# Patient Record
Sex: Male | Born: 1990 | Race: White | Hispanic: No | Marital: Married | Smoking: Never smoker
Health system: Southern US, Community
[De-identification: ages and names within clinical notes are randomized; demographics above are authoritative.]

## PROBLEM LIST (undated history)

## (undated) DIAGNOSIS — J45909 Unspecified asthma, uncomplicated: Secondary | ICD-10-CM

---

## 2012-11-01 ENCOUNTER — Encounter (HOSPITAL_COMMUNITY): Payer: Self-pay

## 2012-11-01 ENCOUNTER — Emergency Department (INDEPENDENT_AMBULATORY_CARE_PROVIDER_SITE_OTHER)
Admission: EM | Admit: 2012-11-01 | Discharge: 2012-11-01 | Disposition: A | Discharge status: Home / Self Care | Payer: Self-pay | Source: Home / Self Care

## 2012-11-01 DIAGNOSIS — J309 Allergic rhinitis, unspecified: Secondary | ICD-10-CM

## 2012-11-01 DIAGNOSIS — J45909 Unspecified asthma, uncomplicated: Secondary | ICD-10-CM

## 2012-11-01 HISTORY — DX: Unspecified asthma, uncomplicated: J45.909

## 2012-11-01 MED ORDER — METHYLPREDNISOLONE 4 MG PO KIT: PACK | ORAL | Status: DC

## 2012-11-01 MED ORDER — FLUTICASONE-SALMETEROL 250-50 MCG/DOSE IN AEPB: 1.0000 | INHALATION_SPRAY | Freq: Two times a day (BID) | RESPIRATORY_TRACT | Status: DC

## 2012-11-01 MED ORDER — ALBUTEROL SULFATE HFA 108 (90 BASE) MCG/ACT IN AERS: 1.0000 | INHALATION_SPRAY | Freq: Four times a day (QID) | RESPIRATORY_TRACT | Status: DC | PRN

## 2012-11-01 NOTE — ED Notes (Signed)
Per patient , he has had SOB today; hist of asthma as child, but no problems for several years. Went to a party over weekend, and smoked for 1st time in many years; NAD at  present

## 2012-11-01 NOTE — ED Provider Notes (Signed)
History     CSN: [id number]  Arrival date & time 11/01/12  1656   First MD Initiated Contact with Patient 11/01/12 1747      Chief Complaint  Patient presents with  . Asthma    (Consider location/radiation/quality/duration/timing/severity/associated sxs/prior treatment) HPI Comments: 22 year old male with a history of asthma recently developed mild allergy symptoms followed by tightness in the chest associated with wheezing. He has been using his brother's Proventil inhaler with modest to moderate relief. He apparently triggered an exacerbation after smoking recently. He denies fever, chills, earache, sore throat. He does state that he has a scratchy throat and PND.   Past Medical History  Diagnosis Date  . Asthma     History reviewed. No pertinent past surgical history.  History reviewed. No pertinent family history.  History  Substance Use Topics  . Smoking status: Not on file  . Smokeless tobacco: Not on file  . Alcohol Use: Not on file      Review of Systems  Constitutional: Positive for activity change. Negative for fever, diaphoresis and fatigue.  HENT: Positive for congestion and postnasal drip. Negative for ear pain, sore throat, facial swelling, rhinorrhea, trouble swallowing, neck pain and neck stiffness.   Eyes: Negative for pain, discharge and redness.  Respiratory: Positive for cough, chest tightness, shortness of breath and wheezing.   Cardiovascular: Negative.   Gastrointestinal: Negative.   Musculoskeletal: Negative.   Neurological: Negative.   Psychiatric/Behavioral: Negative.     Allergies  Ceclor  Home Medications   Current Outpatient Rx  Name  Route  Sig  Dispense  Refill  . albuterol (PROVENTIL HFA;VENTOLIN HFA) 108 (90 BASE) MCG/ACT inhaler   Inhalation   Inhale 1-2 puffs into the lungs every 6 (six) hours as needed for wheezing.   1 Inhaler   0   . Fluticasone-Salmeterol (ADVAIR DISKUS) 250-50 MCG/DOSE AEPB   Inhalation   Inhale 1  puff into the lungs 2 (two) times daily.   60 each   1   . methylPREDNISolone (MEDROL, PAK,) 4 MG tablet      follow package directions   21 tablet   0     BP 146/98  Pulse 78  Temp(Src) 98.2 F (36.8 C) (Oral)  Resp 20  SpO2 98%  Physical Exam  Nursing note and vitals reviewed. Constitutional: He is oriented to person, place, and time. He appears well-developed and well-nourished. No distress.  HENT:  Right Ear: External ear normal.  Left Ear: External ear normal.  Oropharynx with minor erythema, cobblestoning and clear PND. No exudates.  Eyes: Conjunctivae and EOM are normal.  Neck: Normal range of motion. Neck supple.  Cardiovascular: Normal rate, regular rhythm and normal heart sounds.   Pulmonary/Chest: Effort normal. No respiratory distress. He has wheezes.  Mild bilateral and expiratory wheeze with mildly prolonged expiratory phase. Good air movement.  Musculoskeletal: Normal range of motion.  Lymphadenopathy:    He has no cervical adenopathy.  Neurological: He is alert and oriented to person, place, and time.  Skin: Skin is warm and dry. No rash noted.  Psychiatric: He has a normal mood and affect.    ED Course  Procedures (including critical care time)  Labs Reviewed - No data to display No results found.   1. RAD (reactive airway disease) with wheezing   2. Allergic rhinitis due to allergen       MDM  Rx for albuterol HFA 2 puffs every 4-6 hours when necessary cough wheeze Medrol Dosepak Advair discus  2 50/50 one inhalation twice a day. Note that the pharmacist may substitute for HFA 230/21 with same Sig if it  is less expensive. Also take an antihistamine daily may use Allegra 180, Claritin 10 or Zyrtec on a daily basis.  Hayden Rasmussen, NP 11/01/12 [phone]

## 2012-11-03 NOTE — ED Provider Notes (Signed)
Medical screening examination/treatment/procedure(s) were performed by resident physician or non-physician practitioner and as supervising physician I was immediately available for consultation/collaboration.   Barkley Bruns MD.   Linna Hoff, MD 11/03/12 [phone]

## 2016-10-29 ENCOUNTER — Encounter (INDEPENDENT_AMBULATORY_CARE_PROVIDER_SITE_OTHER): Payer: Self-pay

## 2016-10-29 ENCOUNTER — Encounter: Payer: Self-pay | Admitting: Family Medicine

## 2016-10-29 ENCOUNTER — Ambulatory Visit (INDEPENDENT_AMBULATORY_CARE_PROVIDER_SITE_OTHER): Payer: Federal, State, Local not specified - PPO | Admitting: Family Medicine

## 2016-10-29 VITALS — BP 100/80 | HR 78 | Ht 70.5 in | Wt 231.0 lb

## 2016-10-29 DIAGNOSIS — Z7689 Persons encountering health services in other specified circumstances: Secondary | ICD-10-CM

## 2016-10-29 DIAGNOSIS — Z6832 Body mass index (BMI) 32.0-32.9, adult: Secondary | ICD-10-CM | POA: Diagnosis not present

## 2016-10-29 DIAGNOSIS — Z23 Encounter for immunization: Secondary | ICD-10-CM

## 2016-10-29 DIAGNOSIS — J4520 Mild intermittent asthma, uncomplicated: Secondary | ICD-10-CM | POA: Diagnosis not present

## 2016-10-29 LAB — LIPID PANEL
Cholesterol: 170 mg/dL (ref 0–200)
HDL: 37.7 mg/dL — ABNORMAL LOW (ref >39.00)
NONHDL: 132.32
Total CHOL/HDL Ratio: 5
Triglycerides: 216 mg/dL — ABNORMAL HIGH (ref 0.0–149.0)
VLDL: 43.2 mg/dL — ABNORMAL HIGH (ref 0.0–40.0)

## 2016-10-29 LAB — LDL CHOLESTEROL, DIRECT: LDL DIRECT: 101 mg/dL

## 2016-10-29 NOTE — Progress Notes (Signed)
   Subjective:    Patient ID: Gilbert Herrera, male    DOB: Dec 04, 1990, 26 y.o.   MRN: [id number]  HPI This is a 26 yo male who presents today to establish care. He requests tdap. His wife is being induced today.  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 Wants to go to med school, considering dermatology. Currently at [employer], considering [facility] [employer]. Had active job, now with more sedentary job, has gained 30 pounds.   Asthma- rare use of inhaler, once every 3 months. Rare URI.   Past Medical History:  Diagnosis Date  . Asthma    History reviewed. No pertinent surgical history. Family History  Problem Relation Age of Onset  . Hypertension Father   . Heart attack Maternal Grandfather   . Cancer Paternal Grandmother    Social History  Substance Use Topics  . Smoking status: Never Smoker  . Smokeless tobacco: Never Used  . Alcohol use Yes     Comment: Socially      Review of Systems  Constitutional: Negative.   Respiratory: Negative.   Cardiovascular: Negative.   Psychiatric/Behavioral: Negative.        Objective:   Physical Exam Physical Exam  Constitutional: Oriented to person, place, and time. He appears well-developed and well-nourished.  HENT:  Head: Normocephalic and atraumatic.  Eyes: Conjunctivae are normal.  Neck: Normal range of motion. Neck supple.  Cardiovascular: Normal rate, regular rhythm and normal heart sounds.   Pulmonary/Chest: Effort normal and breath sounds normal.  Musculoskeletal: Normal range of motion.  Neurological: Alert and oriented to person, place, and time.  Skin: Skin is warm and dry.  Psychiatric: Normal mood and affect. Behavior is normal. Judgment and thought content normal.  Vitals reviewed.    BP 100/80   Pulse 78   Ht 5' 10.5" (1.791 m)   Wt 231 lb (104.8 kg)   SpO2 98%   BMI 32.68 kg/m  [facility] screen [facility] 10/29/2016  Decreased Interest 0  Down, Depressed, Hopeless 0  PHQ - 2 Score 0     Assessment & Plan:  1. Need for Tdap vaccination - Tdap vaccine greater than or equal to 7yo IM  2. Encounter to establish care - Discussed and encouraged healthy lifestyle choices- adequate sleep, regular exercise, stress management and healthy food choices.    3. BMI 32.0-32.9,adult - encouraged him to limit fast foods, to increase vegetables - Lipid panel  - Return in 1 year Olean Reeeborah Gessner, FNP-BC  [facility] [place]  10/29/2016 2:06 PM

## 2016-10-29 NOTE — Patient Instructions (Signed)

## 2016-10-31 ENCOUNTER — Telehealth: Payer: Self-pay | Admitting: General Practice

## 2016-10-31 NOTE — Telephone Encounter (Signed)
Patient is returning your call RE lab results. ° °Thank you, ° °-LL °

## 2017-05-08 ENCOUNTER — Ambulatory Visit: Payer: Federal, State, Local not specified - PPO | Admitting: Family Medicine

## 2017-08-28 ENCOUNTER — Encounter: Payer: Self-pay | Admitting: Family Medicine

## 2017-08-28 ENCOUNTER — Ambulatory Visit (INDEPENDENT_AMBULATORY_CARE_PROVIDER_SITE_OTHER): Payer: 59 | Admitting: Family Medicine

## 2017-08-28 VITALS — BP 128/78 | HR 97 | Temp 98.1°F | Wt 233.5 lb

## 2017-08-28 DIAGNOSIS — J4520 Mild intermittent asthma, uncomplicated: Secondary | ICD-10-CM | POA: Diagnosis not present

## 2017-08-28 DIAGNOSIS — M25562 Pain in left knee: Secondary | ICD-10-CM

## 2017-08-28 DIAGNOSIS — Z6832 Body mass index (BMI) 32.0-32.9, adult: Secondary | ICD-10-CM | POA: Diagnosis not present

## 2017-08-28 DIAGNOSIS — M25561 Pain in right knee: Secondary | ICD-10-CM | POA: Diagnosis not present

## 2017-08-28 MED ORDER — MELOXICAM 15 MG PO TABS: 15.0000 mg | ORAL_TABLET | Freq: Every day | ORAL | 0 refills | Status: DC | PRN

## 2017-08-28 MED ORDER — FLUTICASONE-SALMETEROL 250-50 MCG/DOSE IN AEPB: 1.0000 | INHALATION_SPRAY | Freq: Two times a day (BID) | RESPIRATORY_TRACT | 5 refills | Status: DC

## 2017-08-28 NOTE — Progress Notes (Signed)
Subjective:    Patient ID: Gilbert Herrera, male    DOB: Mar 29, 1991, 27 y.o.   MRN: [id number]  HPI This is a 27 yo male, accompanied by his wife and infant daughter, who presents today with bilateral knee pain for about a month. Has noticed since moving into a new house l that he has some popping and pain on lateral aspects. No swelling noticed. No pain with rest. Some pain with dead lifts, squats, not with leg extensions. Has taken ibuprofen 2 tablets once a day or every other day with temporary relief. Has noticed that knees are more achy in general with weight gain. No recent falls, no trauma, no weakness.   Drinks 2 20 ounce sodas and a bottle of regular Gatorade daily. Exercises 1-2 times per week.   Past Medical History:  Diagnosis Date  . Asthma    No past surgical history on file. Family History  Problem Relation Age of Onset  . Hypertension Father   . Heart attack Maternal Grandfather   . Cancer Paternal Grandmother    Social History   Tobacco Use  . Smoking status: Never Smoker  . Smokeless tobacco: Never Used  Substance Use Topics  . Alcohol use: Yes    Comment: Socially  . Drug use: No      Review of Systems Per HPI    Objective:   Physical Exam  Constitutional: He is oriented to person, place, and time. He appears well-developed and well-nourished. No distress.  Obese.   HENT:  Head: Normocephalic and atraumatic.  Eyes: Conjunctivae are normal.  Cardiovascular: Normal rate.  Pulmonary/Chest: Effort normal.  Musculoskeletal: He exhibits no edema.       Right knee: He exhibits normal range of motion, no swelling, no effusion, normal alignment, no LCL laxity, normal patellar mobility, no bony tenderness, normal meniscus and no MCL laxity. No tenderness found.       Left knee: He exhibits normal range of motion, no swelling, no effusion, normal alignment, no LCL laxity, normal patellar mobility, no bony tenderness, normal meniscus and no MCL laxity. No  tenderness found.  Audible clicking with extension of both knees.   Neurological: He is alert and oriented to person, place, and time.  Skin: Skin is warm and dry. He is not diaphoretic.  Psychiatric: He has a normal mood and affect. His behavior is normal. Judgment and thought content normal.  Vitals reviewed.     .BP 128/78   Pulse 97   Temp 98.1 F (36.7 C) (Oral)   Wt 233 lb 8 oz (105.9 kg)   SpO2 95%   BMI 33.03 kg/m  Weight: 233 lb 8 oz (105.9 kg)  Wt Readings from Last 3 Encounters:  08/28/17 233 lb 8 oz (105.9 kg)  10/29/16 231 lb (104.8 kg)       Assessment & Plan:  1. Acute pain of both knees -Provided written and verbal information regarding diagnosis and treatment. - provided written instructions for knee exercises - if not improved in 2-3 weeks, suggested he see Dr. Patsy Lager - meloxicam (MOBIC) 15 MG tablet; Take 1 tablet (15 mg total) by mouth daily as needed for pain.  Dispense: 30 tablet; Refill: 0  2. Mild intermittent asthma without complication - Fluticasone-Salmeterol (ADVAIR DISKUS) 250-50 MCG/DOSE AEPB; Inhale 1 puff into the lungs 2 (two) times daily.  Dispense: 60 each; Refill: 5  3. BMI 32.0-32.9,adult - discussed goal weight and encouraged him to avoid calorie containing beverages, increase lean protein, vegetables, increase  daily walking  - F/U for CPE in 3-6 months  Gilbert Reeeborah Gessner, FNP-BC  [facility]  08/29/2017 8:19 AM

## 2017-08-28 NOTE — Patient Instructions (Signed)
Please take meloxicam daily for 5-7 days then daily as needed If not better in 2 weeks, follow up with Dr. Patsy [place]  Knee Exercises Ask your health care provider which exercises are safe for you. Do exercises exactly as told by your health care provider and adjust them as directed. It is normal to feel mild stretching, pulling, tightness, or discomfort as you do these exercises, but you should stop right away if you feel sudden pain or your pain gets worse.Do not begin these exercises until told by your health care provider. STRETCHING AND RANGE OF MOTION EXERCISES These exercises warm up your muscles and joints and improve the movement and flexibility of your knee. These exercises also help to relieve pain, numbness, and tingling. Exercise A: Knee Extension, Prone 1. Lie on your abdomen on a bed. 2. Place your left / right knee just beyond the edge of the surface so your knee is not on the bed. You can put a towel under your left / right thigh just above your knee for comfort. 3. Relax your leg muscles and allow gravity to straighten your knee. You should feel a stretch behind your left / right knee. 4. Hold this position for __________ seconds. 5. Scoot up so your knee is supported between repetitions. Repeat __________ times. Complete this stretch __________ times a day. Exercise B: Knee Flexion, Active  1. Lie on your back with both knees straight. If this causes back discomfort, bend your left / right knee so your foot is flat on the floor. 2. Slowly slide your left / right heel back toward your buttocks until you feel a gentle stretch in the front of your knee or thigh. 3. Hold this position for __________ seconds. 4. Slowly slide your left / right heel back to the starting position. Repeat __________ times. Complete this exercise __________ times a day. Exercise C: Quadriceps, Prone  1. Lie on your abdomen on a firm surface, such as a bed or padded floor. 2. Bend your left / right knee  and hold your ankle. If you cannot reach your ankle or pant leg, loop a belt around your foot and grab the belt instead. 3. Gently pull your heel toward your buttocks. Your knee should not slide out to the side. You should feel a stretch in the front of your thigh and knee. 4. Hold this position for __________ seconds. Repeat __________ times. Complete this stretch __________ times a day. Exercise D: Hamstring, Supine 1. Lie on your back. 2. Loop a belt or towel over the ball of your left / right foot. The ball of your foot is on the walking surface, right under your toes. 3. Straighten your left / right knee and slowly pull on the belt to raise your leg until you feel a gentle stretch behind your knee. ? Do not let your left / right knee bend while you do this. ? Keep your other leg flat on the floor. 4. Hold this position for __________ seconds. Repeat __________ times. Complete this stretch __________ times a day. STRENGTHENING EXERCISES These exercises build strength and endurance in your knee. Endurance is the ability to use your muscles for a long time, even after they get tired. Exercise E: Quadriceps, Isometric  1. Lie on your back with your left / right leg extended and your other knee bent. Put a rolled towel or small pillow under your knee if told by your health care provider. 2. Slowly tense the muscles in the front of your  left / right thigh. You should see your kneecap slide up toward your hip or see increased dimpling just above the knee. This motion will push the back of the knee toward the floor. 3. For __________ seconds, keep the muscle as tight as you can without increasing your pain. 4. Relax the muscles slowly and completely. Repeat __________ times. Complete this exercise __________ times a day. Exercise F: Straight Leg Raises - Quadriceps 1. Lie on your back with your left / right leg extended and your other knee bent. 2. Tense the muscles in the front of your left /  right thigh. You should see your kneecap slide up or see increased dimpling just above the knee. Your thigh may even shake a bit. 3. Keep these muscles tight as you raise your leg 4-6 inches (10-15 cm) off the floor. Do not let your knee bend. 4. Hold this position for __________ seconds. 5. Keep these muscles tense as you lower your leg. 6. Relax your muscles slowly and completely after each repetition. Repeat __________ times. Complete this exercise __________ times a day. Exercise G: Hamstring, Isometric 1. Lie on your back on a firm surface. 2. Bend your left / right knee approximately __________ degrees. 3. Dig your left / right heel into the surface as if you are trying to pull it toward your buttocks. Tighten the muscles in the back of your thighs to dig as hard as you can without increasing any pain. 4. Hold this position for __________ seconds. 5. Release the tension gradually and allow your muscles to relax completely for __________ seconds after each repetition. Repeat __________ times. Complete this exercise __________ times a day. Exercise H: Hamstring Curls  If told by your health care provider, do this exercise while wearing ankle weights. Begin with __________ weights. Then increase the weight by 1 lb (0.5 kg) increments. Do not wear ankle weights that are more than __________. 1. Lie on your abdomen with your legs straight. 2. Bend your left / right knee as far as you can without feeling pain. Keep your hips flat against the floor. 3. Hold this position for __________ seconds. 4. Slowly lower your leg to the starting position.  Repeat __________ times. Complete this exercise __________ times a day. Exercise I: Squats (Quadriceps) 1. Stand in front of a table, with your feet and knees pointing straight ahead. You may rest your hands on the table for balance but not for support. 2. Slowly bend your knees and lower your hips like you are going to sit in a chair. ? Keep your  weight over your heels, not over your toes. ? Keep your lower legs upright so they are parallel with the table legs. ? Do not let your hips go lower than your knees. ? Do not bend lower than told by your health care provider. ? If your knee pain increases, do not bend as low. 3. Hold the squat position for __________ seconds. 4. Slowly push with your legs to return to standing. Do not use your hands to pull yourself to standing. Repeat __________ times. Complete this exercise __________ times a day. Exercise J: Wall Slides (Quadriceps)  1. Lean your back against a smooth wall or door while you walk your feet out 18-24 inches (46-61 cm) from it. 2. Place your feet hip-width apart. 3. Slowly slide down the wall or door until your knees bend __________ degrees. Keep your knees over your heels, not over your toes. Keep your knees in line with  your hips. 4. Hold for __________ seconds. Repeat __________ times. Complete this exercise __________ times a day. Exercise K: Straight Leg Raises - Hip Abductors 1. Lie on your side with your left / right leg in the top position. Lie so your head, shoulder, knee, and hip line up. You may bend your bottom knee to help you keep your balance. 2. Roll your hips slightly forward so your hips are stacked directly over each other and your left / right knee is facing forward. 3. Leading with your heel, lift your top leg 4-6 inches (10-15 cm). You should feel the muscles in your outer hip lifting. ? Do not let your foot drift forward. ? Do not let your knee roll toward the ceiling. 4. Hold this position for __________ seconds. 5. Slowly return your leg to the starting position. 6. Let your muscles relax completely after each repetition. Repeat __________ times. Complete this exercise __________ times a day. Exercise L: Straight Leg Raises - Hip Extensors 1. Lie on your abdomen on a firm surface. You can put a pillow under your hips if that is more  comfortable. 2. Tense the muscles in your buttocks and lift your left / right leg about 4-6 inches (10-15 cm). Keep your knee straight as you lift your leg. 3. Hold this position for __________ seconds. 4. Slowly lower your leg to the starting position. 5. Let your leg relax completely after each repetition. Repeat __________ times. Complete this exercise __________ times a day. This information is not intended to replace advice given to you by your health care provider. Make sure you discuss any questions you have with your health care provider. Document Released: 04/09/2005 Document Revised: 02/18/2016 Document Reviewed: 04/01/2015 Elsevier Interactive Patient Education  2018 [employer].

## 2017-08-29 ENCOUNTER — Encounter: Payer: Self-pay | Admitting: Family Medicine

## 2017-09-11 DIAGNOSIS — A084 Viral intestinal infection, unspecified: Secondary | ICD-10-CM | POA: Diagnosis not present

## 2017-10-12 ENCOUNTER — Ambulatory Visit
Admission: RE | Admit: 2017-10-12 | Discharge: 2017-10-12 | Disposition: A | Discharge status: Home / Self Care | Payer: 59 | Source: Ambulatory Visit | Attending: Family Medicine | Admitting: Family Medicine

## 2017-10-12 ENCOUNTER — Ambulatory Visit (INDEPENDENT_AMBULATORY_CARE_PROVIDER_SITE_OTHER)
Admission: RE | Admit: 2017-10-12 | Discharge: 2017-10-12 | Disposition: A | Discharge status: Home / Self Care | Payer: 59 | Source: Ambulatory Visit | Attending: Family Medicine | Admitting: Family Medicine

## 2017-10-12 ENCOUNTER — Other Ambulatory Visit: Payer: Self-pay

## 2017-10-12 ENCOUNTER — Encounter: Payer: Self-pay | Admitting: Family Medicine

## 2017-10-12 ENCOUNTER — Ambulatory Visit: Payer: 59 | Admitting: Family Medicine

## 2017-10-12 VITALS — BP 90/60 | HR 73 | Temp 97.8°F | Ht 70.5 in | Wt 232.2 lb

## 2017-10-12 DIAGNOSIS — M2241 Chondromalacia patellae, right knee: Secondary | ICD-10-CM

## 2017-10-12 DIAGNOSIS — M25562 Pain in left knee: Secondary | ICD-10-CM | POA: Diagnosis not present

## 2017-10-12 DIAGNOSIS — M2242 Chondromalacia patellae, left knee: Principal | ICD-10-CM

## 2017-10-12 DIAGNOSIS — J4520 Mild intermittent asthma, uncomplicated: Secondary | ICD-10-CM | POA: Diagnosis not present

## 2017-10-12 DIAGNOSIS — M25561 Pain in right knee: Secondary | ICD-10-CM | POA: Diagnosis not present

## 2017-10-12 MED ORDER — ALBUTEROL SULFATE HFA 108 (90 BASE) MCG/ACT IN AERS: 1.0000 | INHALATION_SPRAY | Freq: Four times a day (QID) | RESPIRATORY_TRACT | 2 refills | Status: DC | PRN

## 2017-10-12 MED ORDER — FLUTICASONE-SALMETEROL 250-50 MCG/DOSE IN AEPB: 1.0000 | INHALATION_SPRAY | Freq: Two times a day (BID) | RESPIRATORY_TRACT | 2 refills | Status: DC

## 2017-10-12 NOTE — Patient Instructions (Signed)
Chondromalacia patella

## 2017-10-12 NOTE — Progress Notes (Signed)
Dr. Karleen Hampshire T. Biridiana Twardowski, MD, CAQ [facility] [address] [place], [address] Phone: [phone] Fax: [phone]  10/12/2017  Patient: Gilbert Herrera, MRN: [id number], DOB: Dec 31, 1990, 27 y.o.  Primary Physician:  Emi Belfast, FNP   Chief Complaint  Patient presents with  . Knees Popping    Not painful   Subjective:   Gilbert Herrera is a 27 y.o. 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  He was referred to me for evaluation for over some anterior  knee crepitus without any significant pain.  He has not had any kind of injury that he can recall.  He has not had an effusion.  He has not any giving leg.  He is having some early anterior clicking and popping with movement and with going downstairs.  He was more concerned that this could be an indicator for some other more major problem.  He has tried some anti-inflammatories, but these have not really helped.  Moved a little while ago - has some patellar crepitus. With anterior -   Past Medical History, Surgical History, Social History, Family History, Problem List, Medications, and Allergies have been reviewed and updated if relevant.  Patient Active Problem List   Diagnosis Date Noted  . BMI 32.0-32.9,adult 10/29/2016  . Mild intermittent asthma without complication 10/29/2016    Past Medical History:  Diagnosis Date  . Asthma     History reviewed. No pertinent surgical history.  Social History   Socioeconomic History  . Marital status: Married    Spouse name: Not on file  . Number of children: Not on file  . Years of education: Not on file  . Highest education level: Not on file  Occupational History  . Not on file  Social Needs  . Financial resource strain: Not on file  . Food insecurity:    Worry: Not on file   Inability: Not on file  . Transportation needs:    Medical: Not on file    Non-medical: Not on file  Tobacco Use  . Smoking status: Never Smoker  . Smokeless tobacco: Never Used  Substance and Sexual Activity  . Alcohol use: Yes    Comment: Socially  . Drug use: No  . Sexual activity: Not on file  Lifestyle  . Physical activity:    Days per week: Not on file    Minutes per session: Not on file  . Stress: Not on file  Relationships  . Social connections:    Talks on phone: Not on file    Gets together: Not on file    Attends religious service: Not on file    Active member of club or organization: Not on file    Attends meetings of clubs or organizations: Not on file    Relationship status: Not on file  . Intimate partner violence:    Fear of current or ex partner: Not on file    Emotionally abused: Not on file    Physically abused: Not on file    Forced sexual activity: Not on file  Other Topics Concern  . Not on file  Social History Narrative  . Not on file    Family History  Problem Relation Age of Onset  . Hypertension Father   . Heart attack Maternal Grandfather   .  Cancer Paternal Grandmother     Allergies  Allergen Reactions  . Ceclor [Cefaclor]     Medication list reviewed and updated in full in [facility].  GEN: No fevers, chills. Nontoxic. Primarily MSK c/o today. MSK: Detailed in the HPI GI: tolerating PO intake without difficulty Neuro: No numbness, parasthesias, or tingling associated. Otherwise the pertinent positives of the ROS are noted above.   Objective:   BP 90/60   Pulse 73   Temp 97.8 F (36.6 C) (Oral)   Ht 5' 10.5" (1.791 m)   Wt 232 lb 4 oz (105.3 kg)   BMI 32.85 kg/m    GEN: WDWN, NAD, Non-toxic, Alert & Oriented x 3 HEENT: Atraumatic, Normocephalic.  Ears and Nose: No external deformity. EXTR: No clubbing/cyanosis/edema NEURO: Normal gait.  PSYCH: Normally interactive. Conversant. Not depressed or anxious  appearing.  Calm demeanor.   Knee:  B Gait: Normal heel toe pattern ROM: 0-135 Effusion: neg Echymosis or edema: none Patellar tendon NT Painful PLICA: neg Patellar grind: He does not have a true patellar grind, but he does have some mechanical clicking and popping adjacent to the kneecap and behind the kneecap with flexion of the leg. Medial and lateral patellar facet loading: negative medial and lateral joint lines:NT Mcmurray's neg Flexion-pinch neg Varus and valgus stress: stable Lachman: neg Ant and Post drawer: neg Hip abduction, IR, ER: WNL Hip flexion str: 5/5 Hip abd: 5/5 Quad: 5/5 VMO atrophy:No Hamstring concentric and eccentric: 5/5   Radiology: Dg Knee 4 Views W/patella Left  Result Date: 10/12/2017 CLINICAL DATA:  BILATERAL anterior knee pain EXAM: LEFT KNEE - COMPLETE 4+ VIEW COMPARISON:  None FINDINGS: Osseous mineralization normal. Joint spaces preserved. No acute fracture, dislocation or bone destruction. No knee joint effusion. IMPRESSION: Normal exam. Electronically Signed   By: Ulyses Southward M.D.   On: 10/12/2017 18:09   Dg Knee 4 Views W/patella Right  Result Date: 10/12/2017 CLINICAL DATA:  BILATERAL anterior knee pain EXAM: RIGHT KNEE - COMPLETE 4+ VIEW COMPARISON:  None FINDINGS: Osseous mineralization normal. Joint spaces preserved. No fracture, dislocation, or bone destruction. No joint effusion. IMPRESSION: Normal exam. Electronically Signed   By: Ulyses Southward M.D.   On: 10/12/2017 18:10     Assessment and Plan:   Chondromalacia of both patellae - Plan: DG Knee 4 Views W/Patella Left, DG Knee 4 Views W/Patella Right  Mild intermittent asthma without complication - Plan: Fluticasone-Salmeterol (ADVAIR DISKUS) 250-50 MCG/DOSE AEPB  >25 minutes spent in face to face time with patient, >50% spent in counselling or coordination of care   Very reassuring exam as well as radiographic appearance of his knees.  I do think he is having some abnormal tracking,  and this is giving him a sensation that is mechanical in character, and he likely has some chondromalacia, but this is not causing him any pain at all.  I reassured him the best that I possibly could and I encouraged him to work on weight loss and to get back into the gym and exercise as much as he physically can do.  Follow-up: No follow-ups on file.  Meds ordered this encounter  Medications  . albuterol (PROVENTIL HFA;VENTOLIN HFA) 108 (90 Base) MCG/ACT inhaler    Sig: Inhale 1-2 puffs into the lungs every 6 (six) hours as needed for wheezing.    Dispense:  1 Inhaler    Refill:  2  . Fluticasone-Salmeterol (ADVAIR DISKUS) 250-50 MCG/DOSE AEPB    Sig: Inhale 1 puff into  the lungs 2 (two) times daily.    Dispense:  60 each    Refill:  2    Orders Placed This Encounter  Procedures  . DG Knee 4 Views W/Patella Left  . DG Knee 4 Views W/Patella Right    Signed,  Karleen Hampshire T. Brack Shaddock, MD   Allergies as of 10/12/2017      Reactions   Ceclor [cefaclor]       Medication List        Accurate as of 10/12/17 11:59 PM. Always use your most recent med list.          albuterol 108 (90 Base) MCG/ACT inhaler Commonly known as:  PROVENTIL HFA;VENTOLIN HFA Inhale 1-2 puffs into the lungs every 6 (six) hours as needed for wheezing.   Fluticasone-Salmeterol 250-50 MCG/DOSE Aepb Commonly known as:  ADVAIR DISKUS Inhale 1 puff into the lungs 2 (two) times daily.   meloxicam 15 MG tablet Commonly known as:  MOBIC Take 1 tablet (15 mg total) by mouth daily as needed for pain.

## 2018-08-24 IMAGING — DX DG KNEE COMPLETE 4+V*L*
3 series · 3 of 3 positions shown · non-contrast
Comparison: None

CLINICAL DATA: BILATERAL anterior knee pain

EXAM:
LEFT KNEE - COMPLETE 4+ VIEW

[knee lat]
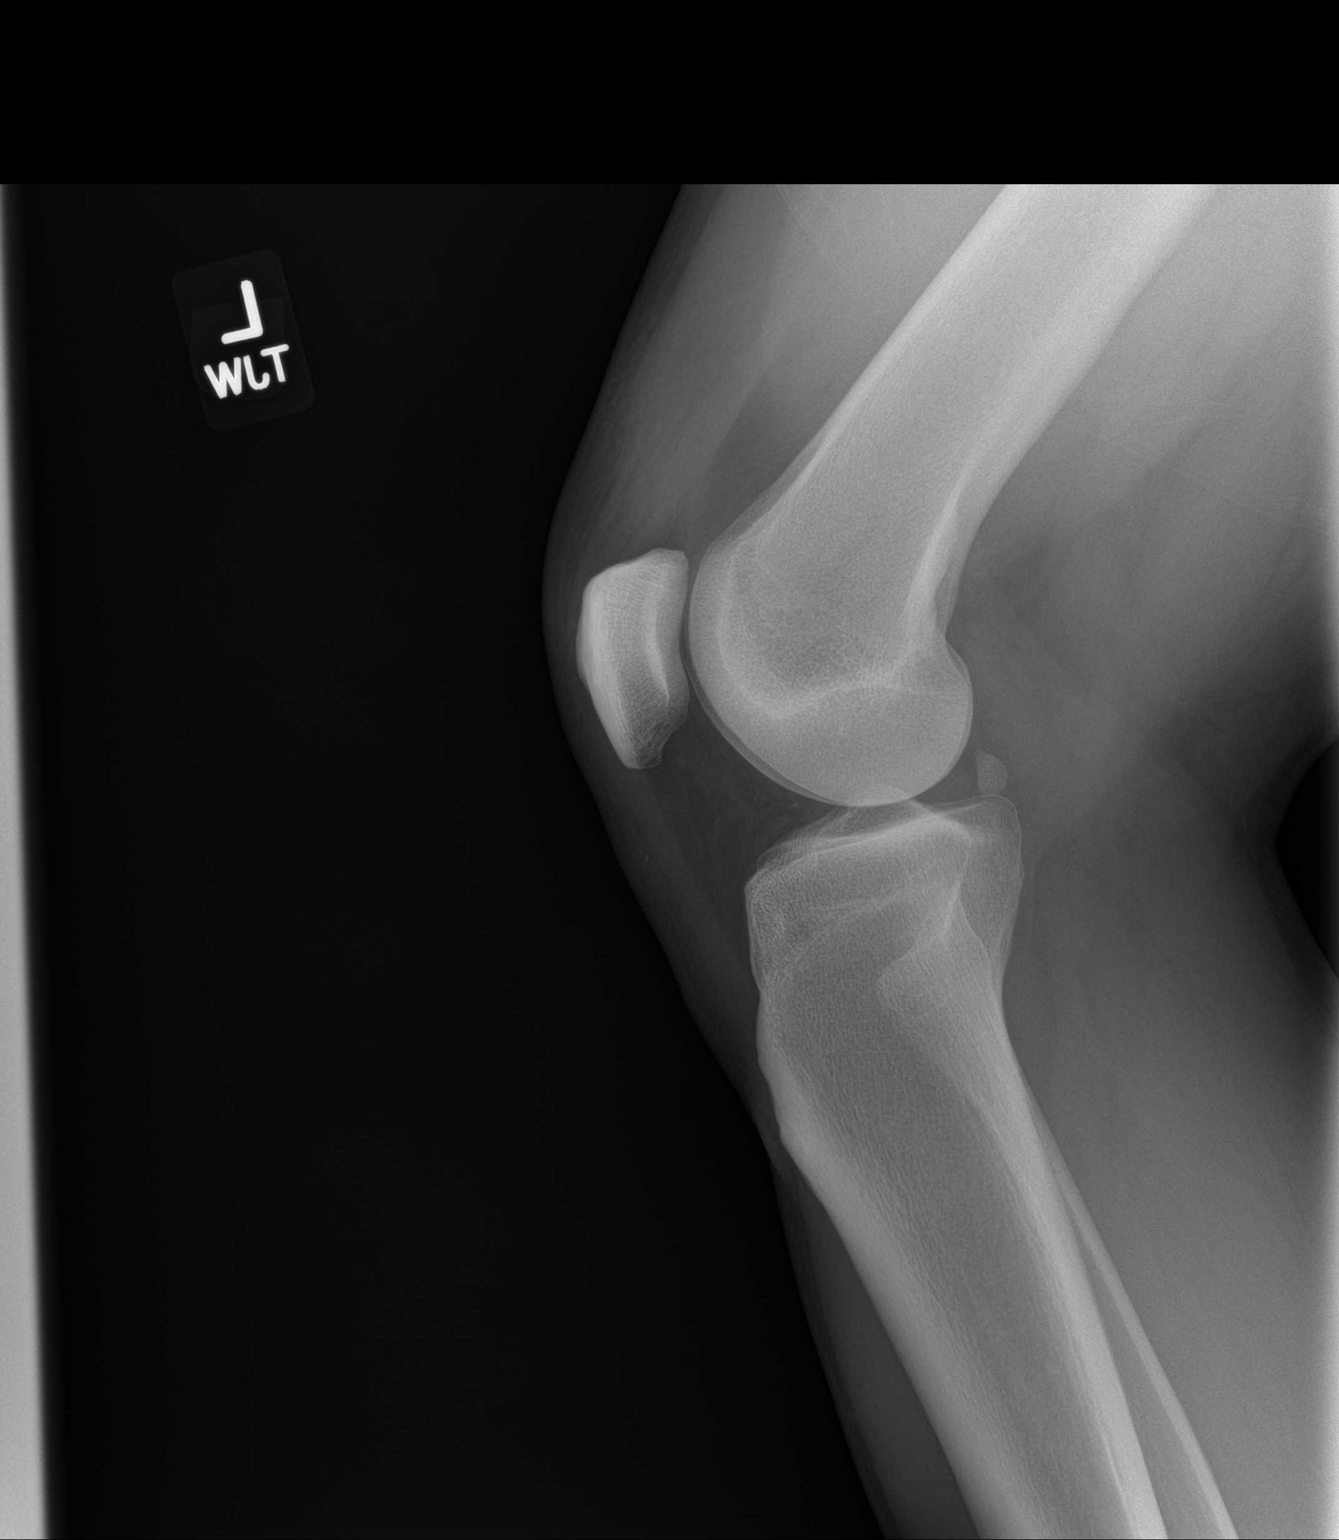

[patella skyline]
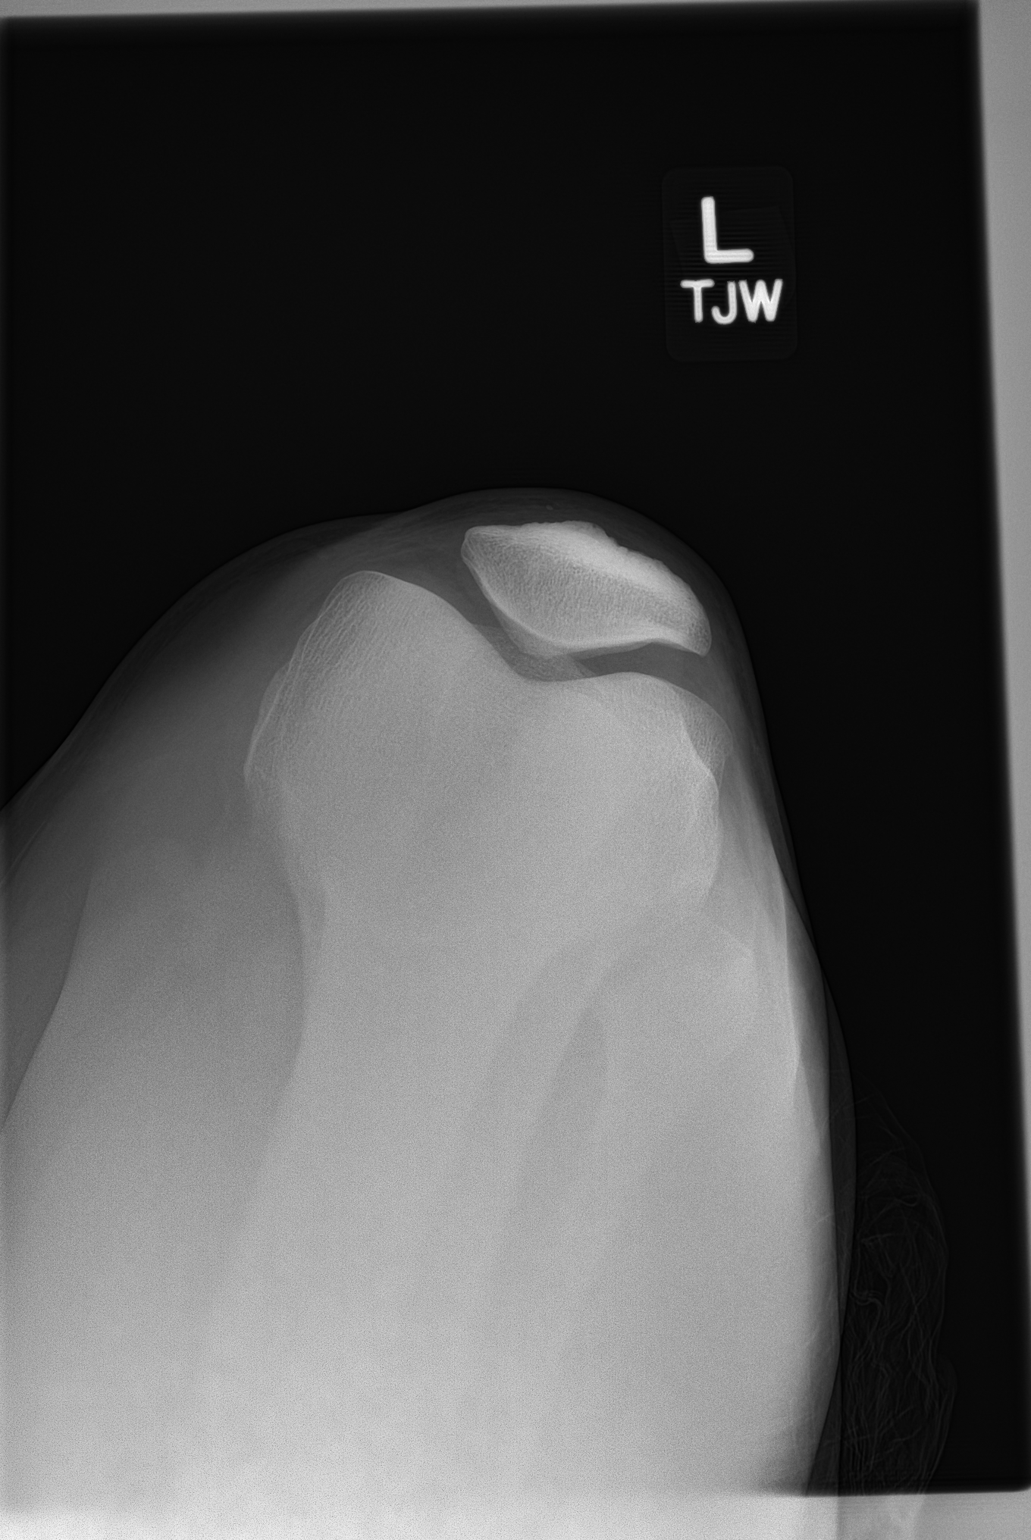

[knee obl]
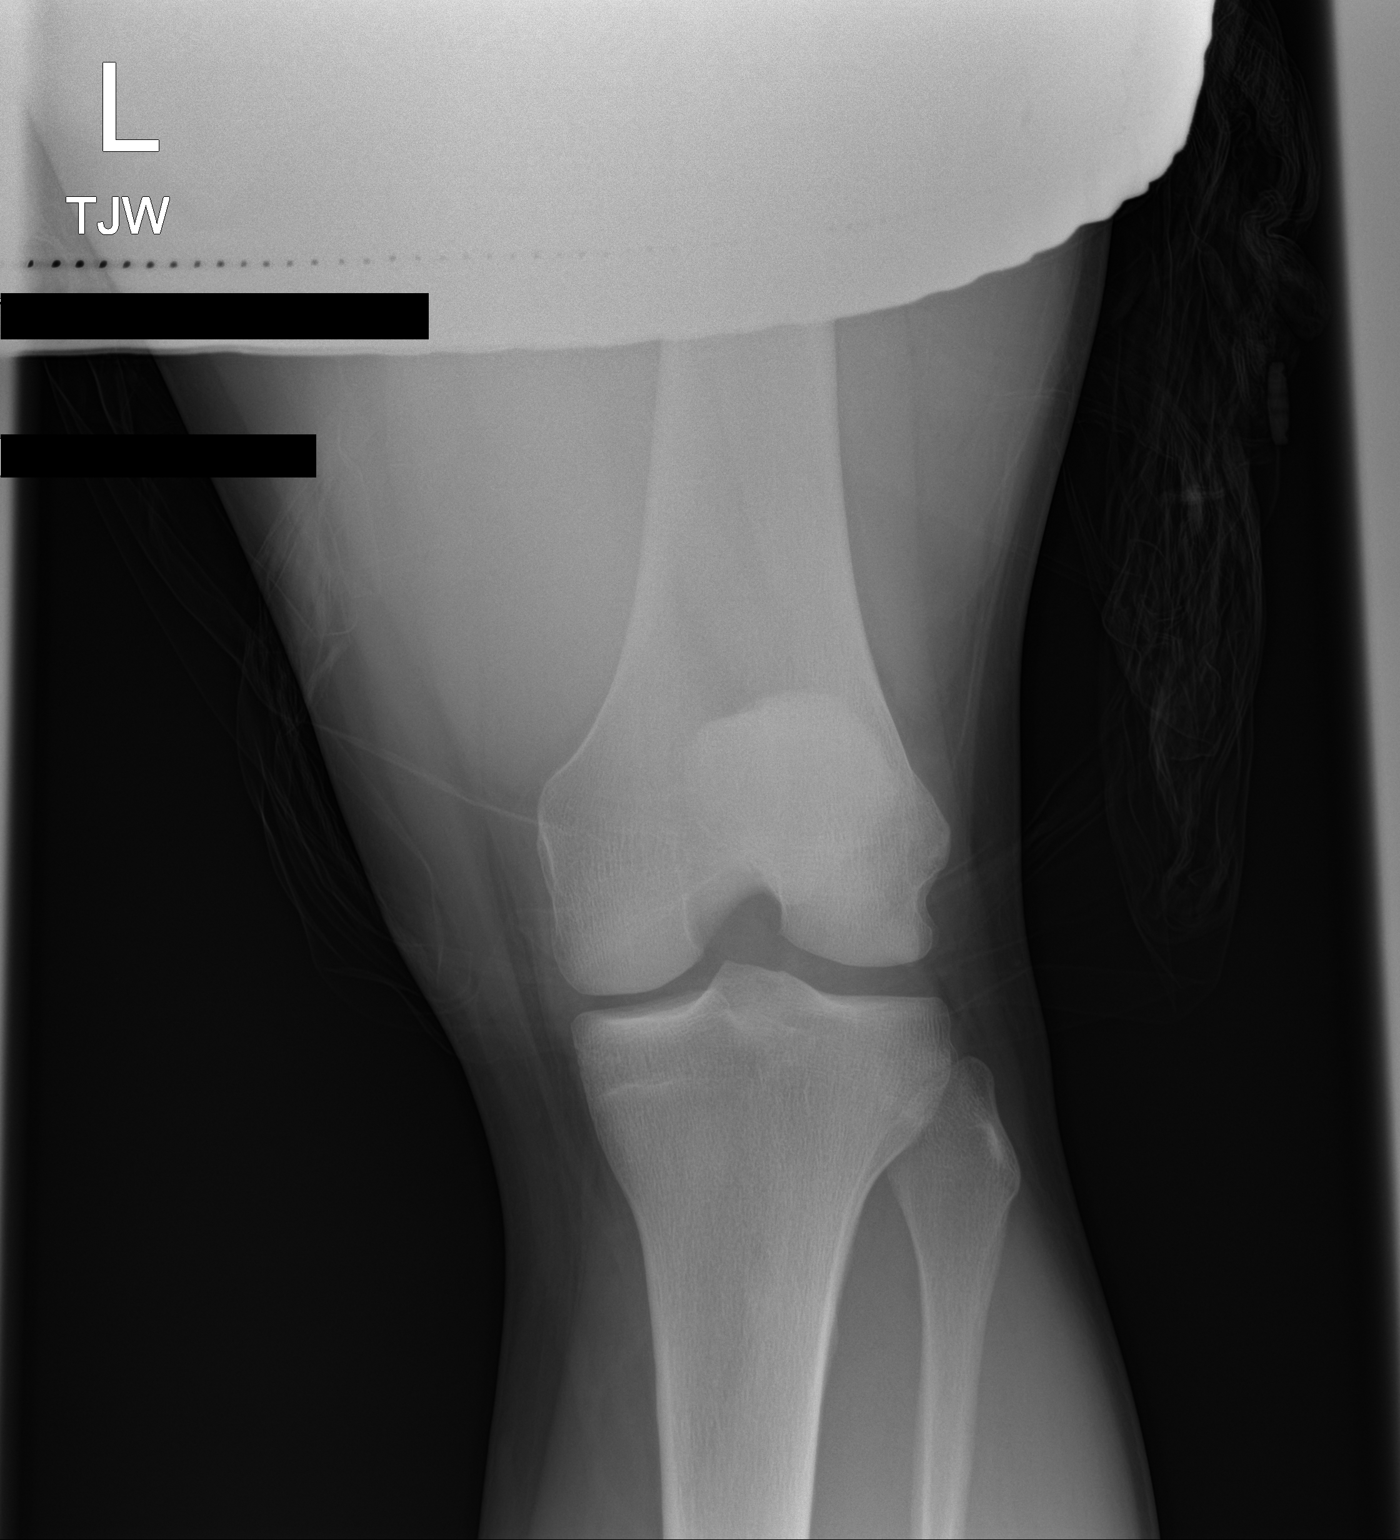

[3 of 3 positions shown; findings below may reference images not displayed]

FINDINGS: Osseous mineralization normal.

Joint spaces preserved.

No acute fracture, dislocation or bone destruction.

No knee joint effusion.
IMPRESSION: Normal exam.

## 2019-03-18 ENCOUNTER — Encounter: Payer: Self-pay | Admitting: Family Medicine

## 2019-03-18 ENCOUNTER — Other Ambulatory Visit: Payer: Self-pay

## 2019-03-18 ENCOUNTER — Ambulatory Visit (INDEPENDENT_AMBULATORY_CARE_PROVIDER_SITE_OTHER): Payer: 59 | Admitting: Family Medicine

## 2019-03-18 VITALS — BP 118/78 | HR 71 | Temp 98.2°F | Ht 70.5 in | Wt 238.1 lb

## 2019-03-18 DIAGNOSIS — Z23 Encounter for immunization: Secondary | ICD-10-CM | POA: Diagnosis not present

## 2019-03-18 DIAGNOSIS — G8929 Other chronic pain: Secondary | ICD-10-CM

## 2019-03-18 DIAGNOSIS — M25552 Pain in left hip: Secondary | ICD-10-CM | POA: Diagnosis not present

## 2019-03-18 DIAGNOSIS — M79642 Pain in left hand: Secondary | ICD-10-CM

## 2019-03-18 DIAGNOSIS — J4520 Mild intermittent asthma, uncomplicated: Secondary | ICD-10-CM | POA: Diagnosis not present

## 2019-03-18 DIAGNOSIS — M79641 Pain in right hand: Secondary | ICD-10-CM

## 2019-03-18 MED ORDER — FLUTICASONE-SALMETEROL 250-50 MCG/DOSE IN AEPB: 1.0000 | INHALATION_SPRAY | Freq: Two times a day (BID) | RESPIRATORY_TRACT | 3 refills | Status: DC

## 2019-03-18 NOTE — Patient Instructions (Addendum)
Good to see you today  Take ibuprofen 3 tablets (600 mg) every 8-12 hours for 5-7 days Gluteus Medius Syndrome Rehab Ask your health care provider which exercises are safe for you. Do exercises exactly as told by your health care provider and adjust them as directed. It is normal to feel mild stretching, pulling, tightness, or discomfort as you do these exercises. Stop right away if you feel sudden pain or your pain gets worse. Do not begin these exercises until told by your health care provider. Stretching and range-of-motion exercise This exercise warms up your muscles and joints and improves the movement and flexibility of your hip and pelvis. This exercise also helps to relieve muscle and joint pain and stiffness. Lunge This exercise is also called hip flexor stretch. 1. Kneel on the floor on your left / right knee. Bend your other knee so it is directly over your ankle. 2. Keep good posture with your head over your shoulders. Tuck your tailbone underneath you. This will prevent your back from arching too much. 3. You should feel a gentle stretch in the front of your back thigh or hip (hip flexors). If you do not feel a stretch, slowly lunge forward with your chest up. 4. Hold this position for ____15______ seconds. 5. Slowly return to the starting position. Repeat _____2_____ times. Complete this exercise _____1_____ times a day. Strengthening exercises These exercises build strength and endurance in your hip and pelvis. Endurance is the ability to use your muscles for a long time, even after they get tired. Bridge This exercise strengthens the muscles that move your thigh backward (hip extensors). 1. Lie on your back on a firm surface with your knees bent and your feet flat on the floor. 2. Tighten your buttocks muscles and lift your bottom off the floor until the trunk of your body is level with your thighs. ? You should feel the muscles working in your buttocks and the back of your  thighs. If this exercise is too easy, cross your arms over your chest or lift one leg while your bottom is up and off the floor. ? Do not arch your back. 3. Hold this position for ____15______ seconds. 4. Slowly lower your hips to the starting position. 5. Let your muscles relax completely after each repetition. Repeat _____3_____ times. Complete this exercise _______1___ times a day. Straight leg raises, side-lying This exercise strengthens the muscles that rotate the leg at the hip and move it away from your body (hip abductors). 1. Lie on your side with your left / right leg in the top position. Lie so your head, shoulder, knee, and hip line up. Bend your bottom knee slightly to help you balance. 2. Lift your top leg 4-6 inches (10-15 cm) while keeping your toes pointed straight ahead. 3. Hold this position for ____10-15______ seconds. 4. Slowly lower your leg to the starting position. 5. Let your muscles relax completely after each repetition. Repeat ____3______ times. Complete this exercise ________1__ times a day. Hip abduction, quadruped This is an exercise in which your hands and knees are on the floor, and you lift one knee out to the side. 1. Get on your hands and knees on a firm, lightly padded surface. Your hands should be directly below your shoulders, and your knees should be directly below your hips. 2. Lift your left / right knee out to the side. Keep your knee bent. Do not twist your body. 3. Hold this position for _____10-15_____ seconds. 4. Slowly lower  your leg back to the starting position. Repeat _____3_____ times. Complete this exercise _____1_____ times a day. Single leg stand 1. Stand near a counter or door frame. Hold on to it as needed. It is helpful to look in a mirror for this exercise so you can watch your hip. 2. Squeeze your left / right buttocks muscles, then lift up your other foot. Do not let your left / right hip push out to the side. 3. Hold this position  for _______15___ seconds. Repeat ________3__ times. Complete this exercise ______1____ times a day. This information is not intended to replace advice given to you by your health care provider. Make sure you discuss any questions you have with your health care provider. Document Released: 05/26/2005 Document Revised: 09/16/2018 Document Reviewed: 03/24/2018 Elsevier Patient Education  2020 [employer].

## 2019-03-18 NOTE — Progress Notes (Signed)
Subjective:    Patient ID: Gilbert Herrera, male    DOB: Dec 10, 1990, 28 y.o.   MRN: [id number]  HPI Chief Complaint  Patient presents with  . Leg Pain    x 1 year - worsening. Worse with exercise and sitting for prolonged periods of time.   . Hand Pain    x 2 weeks. Mostly at night when typing for extended period of time.   This is a 28 yo male who presents with the above chief complaint.. Notices with squats. Pain radiating from left groin toward knee and foot pain. Feels weak with squatting. Pain with sitting for a long period of time. Has to decrease his weight load. Has trouble getting up from squatting. Does stretching after working out.   Bilateral hand pain- Pain inside of palms, feels sore. Thought from overworking ? From yard work. Using manual hedge trimmers. Taking tylenol 2,000 mg at bedtime with a little relief. Good body mechanics at work with keyboard use.   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 Sleeps about 4 hours per night. Working out at gym cuts into his sleep.   Past Medical History:  Diagnosis Date  . Asthma    History reviewed. No pertinent surgical history. Family History  Problem Relation Age of Onset  . Hypertension Father   . Heart attack Maternal Grandfather   . Cancer Paternal Grandmother    Social History   Tobacco Use  . Smoking status: Never Smoker  . Smokeless tobacco: Never Used  Substance Use Topics  . Alcohol use: Yes    Comment: Socially  . Drug use: No       Review of Systems Per HPI    Objective:   Physical Exam Vitals signs reviewed.  Constitutional:      Appearance: Normal appearance. He is obese.  HENT:     Head: Normocephalic and atraumatic.  Eyes:     Conjunctiva/sclera: Conjunctivae normal.  Neck:     Musculoskeletal: Normal range of motion and neck supple.  Cardiovascular:     Rate and Rhythm: Normal rate.  Pulmonary:     Effort:  Pulmonary effort is normal.  Musculoskeletal:     Left hip: He exhibits decreased range of motion (decreased flexion, external rotation. ). He exhibits normal strength, no tenderness, no bony tenderness, no swelling, no crepitus and no deformity.     Right hand: He exhibits normal range of motion, no tenderness, no bony tenderness, normal capillary refill, no deformity and no swelling. Normal strength noted. He exhibits no finger abduction and no thumb/finger opposition.     Left hand: Normal. He exhibits normal range of motion, no tenderness, normal capillary refill, no deformity and no swelling. He exhibits no finger abduction.     Comments: Normal gait. Left sided weakness with rising from deep squat.   Skin:    General: Skin is warm and dry.  Neurological:     Mental Status: He is alert and oriented to person, place, and time.  Psychiatric:        Mood and Affect: Mood normal.        Behavior: Behavior normal.        Thought Content: Thought content normal.        Judgment: Judgment normal.     BP 118/78 (BP Location: Left Arm, Patient Position: Sitting, Cuff Size: Normal)   Pulse 71   Temp 98.2  F (36.8 C) (Temporal)   Ht 5' 10.5" (1.791 m)   Wt 238 lb 1.9 oz (108 kg)   SpO2 97%   BMI 33.68 kg/m  Wt Readings from Last 3 Encounters:  03/18/19 238 lb 1.9 oz (108 kg)  10/12/17 232 lb 4 oz (105.3 kg)  08/28/17 233 lb 8 oz (105.9 kg)      Assessment & Plan:  1. Chronic hip pain, left - No worrisome findings on exam, suggested he see PT or orthopedics. He declines referrals at this time, stating it is difficult to get time to attend visits - provided him with hip exercises and discussed use of otc NSAIDs - encouraged him to let me know if not improved with above and to consider ortho or pt consult  2. Need for influenza vaccination - Flu Vaccine QUAD 6+ mos PF IM (Fluarix Quad PF)  3. Mild intermittent asthma without complication - Fluticasone-Salmeterol (ADVAIR DISKUS)  250-50 MCG/DOSE AEPB; Inhale 1 puff into the lungs 2 (two) times daily.  Dispense: 60 each; Refill: 3  4. Bilateral hand pain - likely related to yard work/over use, suggested trial of otc NSAIDs - if no improvement in 1-2 week, follow up   Clarene Reamer, FNP-BC  [facility], [place]  03/18/2019 5:03 PM

## 2019-05-04 ENCOUNTER — Ambulatory Visit
Admission: EM | Admit: 2019-05-04 | Discharge: 2019-05-04 | Disposition: A | Discharge status: Home / Self Care | Payer: 59 | Attending: Emergency Medicine | Admitting: Emergency Medicine

## 2019-05-04 ENCOUNTER — Encounter: Payer: Self-pay | Admitting: *Deleted

## 2019-05-04 DIAGNOSIS — Z20822 Contact with and (suspected) exposure to covid-19: Secondary | ICD-10-CM

## 2019-05-04 DIAGNOSIS — Z20828 Contact with and (suspected) exposure to other viral communicable diseases: Secondary | ICD-10-CM | POA: Diagnosis not present

## 2019-05-04 NOTE — ED Triage Notes (Addendum)
Patient reports nasal congestion, fever, and headache that started yesterday. Patient reports that his entire is feeling ill. Patient took ibuprofen around noon.

## 2019-05-06 LAB — NOVEL CORONAVIRUS, NAA: SARS-CoV-2, NAA: NOT DETECTED

## 2019-05-19 ENCOUNTER — Encounter: Payer: Self-pay | Admitting: Family Medicine

## 2019-05-19 ENCOUNTER — Ambulatory Visit: Payer: 59 | Admitting: Family Medicine

## 2019-05-19 ENCOUNTER — Other Ambulatory Visit: Payer: Self-pay

## 2019-05-19 VITALS — BP 118/90 | HR 70 | Temp 98.6°F | Ht 70.5 in | Wt 249.5 lb

## 2019-05-19 DIAGNOSIS — M25562 Pain in left knee: Secondary | ICD-10-CM | POA: Diagnosis not present

## 2019-05-19 DIAGNOSIS — R2232 Localized swelling, mass and lump, left upper limb: Secondary | ICD-10-CM | POA: Diagnosis not present

## 2019-05-19 DIAGNOSIS — M25561 Pain in right knee: Secondary | ICD-10-CM | POA: Insufficient documentation

## 2019-05-19 MED ORDER — MELOXICAM 15 MG PO TABS: 15.0000 mg | ORAL_TABLET | Freq: Every day | ORAL | 0 refills | Status: DC | PRN

## 2019-05-19 NOTE — Patient Instructions (Addendum)
#   Finger Nodule - could be rheumatoid nodule or ganglion cyst - If you develop trigger finger then return for steroid injection - if worsening - redness, growing in size let us know - I will reach out if I find other possible causes that would require more work-up or treatment

## 2019-05-19 NOTE — Assessment & Plan Note (Signed)
Discussed that etiology likely ganglion cyst and should resolve on its own. Could be rheumatoid nodule (pt also getting intermittent b/l hand pain) and offered blood work, but pt declined at this time. Discussed that given location and small size would not recommend aspiration at this time. No sign of trigger finger, numbness, or changes in color. Hand surgery referral made if getting worse and wanting to discuss management.

## 2019-05-19 NOTE — Assessment & Plan Note (Signed)
Pt gets pain occasionally and found meloxicam worked well. Requested a refill.

## 2019-05-19 NOTE — Progress Notes (Signed)
   Subjective:     Gilbert Herrera is a 28 y.o. male presenting for Lump finger (on left middle finger. noticed 2 days ago. Hurts with direct contact)     HPI   #Finger lump - noticed 2 days ago - pain with gripping - not hx of issues in the past - in the last 2 month has been having wrist and hand pain - no redness or swelling - middle finger    Review of Systems  Skin:       nodule  Neurological: Negative for numbness.     Social History   Tobacco Use  Smoking Status Never Smoker  Smokeless Tobacco Never Used        Objective:    BP Readings from Last 3 Encounters:  05/19/19 118/90  05/04/19 (!) 153/92  03/18/19 118/78   Wt Readings from Last 3 Encounters:  05/19/19 249 lb 8 oz (113.2 kg)  03/18/19 238 lb 1.9 oz (108 kg)  10/12/17 232 lb 4 oz (105.3 kg)    BP 118/90   Pulse 70   Temp 98.6 F (37 C)   Ht 5' 10.5" (1.791 m)   Wt 249 lb 8 oz (113.2 kg)   SpO2 97%   BMI 35.29 kg/m    Physical Exam Constitutional:      Appearance: Normal appearance. He is not ill-appearing or diaphoretic.  HENT:     Right Ear: External ear normal.     Left Ear: External ear normal.  Eyes:     General: No scleral icterus.    Extraocular Movements: Extraocular movements intact.     Conjunctiva/sclera: Conjunctivae normal.  Cardiovascular:     Rate and Rhythm: Normal rate.  Pulmonary:     Effort: Pulmonary effort is normal.  Musculoskeletal:     Cervical back: Neck supple.     Comments: Left 3rd digit: palmar surface nodule which is adjacent to the tendon, firm and mobile.   Skin:    General: Skin is warm and dry.  Neurological:     Mental Status: He is alert. Mental status is at baseline.  Psychiatric:        Mood and Affect: Mood normal.           Assessment & Plan:   Problem List Items Addressed This Visit      Other   Nodule of finger of left hand - Primary    Discussed that etiology likely ganglion cyst and should resolve on its own. Could  be rheumatoid nodule (pt also getting intermittent b/l hand pain) and offered blood work, but pt declined at this time. Discussed that given location and small size would not recommend aspiration at this time. No sign of trigger finger, numbness, or changes in color. Hand surgery referral made if getting worse and wanting to discuss management.       Relevant Orders   Ambulatory referral to Hand Surgery   Acute pain of both knees    Pt gets pain occasionally and found meloxicam worked well. Requested a refill.       Relevant Medications   meloxicam (MOBIC) 15 MG tablet       Return if symptoms worsen or fail to improve.  Lesleigh Noe, MD

## 2019-08-27 ENCOUNTER — Ambulatory Visit: Payer: 59 | Attending: Internal Medicine

## 2019-08-27 DIAGNOSIS — Z23 Encounter for immunization: Secondary | ICD-10-CM

## 2019-08-27 NOTE — Progress Notes (Signed)
   Covid-19 Vaccination Clinic  Name:  Gilbert Herrera    MRN: [id number] DOB: [birth year]  08/27/2019  [name] was observed post Covid-19 immunization for 15 minutes without incident. He was provided with Vaccine Information Sheet and instruction to access the V-Safe system.   [name] was instructed to call 911 with any severe reactions post vaccine: Marland Kitchen Difficulty breathing  . Swelling of face and throat  . A fast heartbeat  . A bad rash all over body  . Dizziness and weakness   Immunizations Administered    Name Date Dose VIS Date Route   Pfizer COVID-19 Vaccine 08/27/2019  2:08 PM 0.3 mL 05/20/2019 Intramuscular   Manufacturer: [employer], [employer]   Lot: [id number]   NDC: [address]-2

## 2019-09-21 ENCOUNTER — Ambulatory Visit: Payer: 59 | Attending: Internal Medicine

## 2019-09-21 DIAGNOSIS — Z23 Encounter for immunization: Secondary | ICD-10-CM

## 2019-09-21 NOTE — Progress Notes (Signed)
   Covid-19 Vaccination Clinic  Name:  Gilbert Herrera    MRN: [id number] DOB: [birth year]  09/21/2019  Gilbert Herrera was observed post Covid-19 immunization for 15 minutes without incident. He was provided with Vaccine Information Sheet and instruction to access the V-Safe system.   Gilbert Herrera was instructed to call 911 with any severe reactions post vaccine: Marland Kitchen Difficulty breathing  . Swelling of face and throat  . A fast heartbeat  . A bad rash all over body  . Dizziness and weakness   Immunizations Administered    Name Date Dose VIS Date Route   Pfizer COVID-19 Vaccine 09/21/2019  1:35 PM 0.3 mL 05/20/2019 Intramuscular   Manufacturer: [employer], [employer]   Lot: [id number]   NDC: [address]-6

## 2020-05-29 ENCOUNTER — Other Ambulatory Visit: Payer: Self-pay | Admitting: Family Medicine

## 2020-05-29 DIAGNOSIS — M25561 Pain in right knee: Secondary | ICD-10-CM

## 2020-05-30 ENCOUNTER — Encounter: Payer: Self-pay | Admitting: Family Medicine

## 2020-10-25 ENCOUNTER — Telehealth: Payer: Self-pay

## 2020-10-25 NOTE — Telephone Encounter (Signed)
Noted. Thanks.

## 2020-10-25 NOTE — Telephone Encounter (Signed)
Cindy at front desk brought me a note from pt message; for one month pt has had high BP. Today BP 150/106 P 85. Pt had been resting prior to BP being taken but pt said he is stressed today as well. When pt gets stressed he has a mild H/A pain level 2. Earlier today pt did have some SOB but none now. SOB was worse 4 days ago after sexual encounter; pt said does not have SOB until end of sexual encounter and pt also hyperventilates for at least 5 mins. After pt calms down the SOB and hyperventilating goes away. Pt said if he goes up and down stairs he also has SOB. Pt said he has gained wt this past year and is approx 250 lbs.pt has had no CP at any time.pt also does not have dizziness. Pt said has had slight sinus congestion for 1 - 2 wks, no S/T or runny nose and no other covid symptoms. Pt will ck a home covid test tonight and pt is not in any distress at this time. Advised pt if covid test is positive pt will call and cancel 10/26/20 appt and pt will go to [facility] and if covid test is negative pt will keep in office appt with Dr Para March on 10/26/20. Sending note to Dr Para March and Shanda Bumps CMA.

## 2020-10-26 ENCOUNTER — Other Ambulatory Visit: Payer: Self-pay

## 2020-10-26 ENCOUNTER — Ambulatory Visit: Payer: 59 | Admitting: Family Medicine

## 2020-10-26 ENCOUNTER — Encounter: Payer: Self-pay | Admitting: Family Medicine

## 2020-10-26 DIAGNOSIS — J45909 Unspecified asthma, uncomplicated: Secondary | ICD-10-CM | POA: Diagnosis not present

## 2020-10-26 MED ORDER — HYDROXYZINE HCL 10 MG PO TABS: 5.0000 mg | ORAL_TABLET | Freq: Three times a day (TID) | ORAL | 0 refills | Status: DC | PRN

## 2020-10-26 MED ORDER — FLUTICASONE-SALMETEROL 250-50 MCG/ACT IN AEPB: 1.0000 | INHALATION_SPRAY | Freq: Two times a day (BID) | RESPIRATORY_TRACT | 5 refills | Status: DC

## 2020-10-26 MED ORDER — ALBUTEROL SULFATE HFA 108 (90 BASE) MCG/ACT IN AERS: 1.0000 | INHALATION_SPRAY | Freq: Four times a day (QID) | RESPIRATORY_TRACT | 2 refills | Status: DC | PRN

## 2020-10-26 MED ORDER — FLUTICASONE PROPIONATE 50 MCG/ACT NA SUSP: 2.0000 | Freq: Every day | NASAL | 6 refills | Status: AC

## 2020-10-26 NOTE — Progress Notes (Signed)
This visit occurred during the SARS-CoV-2 public health emergency.  Safety protocols were in place, including screening questions prior to the visit, additional usage of staff PPE, and extensive cleaning of exam room while observing appropriate contact time as indicated for disinfecting solutions.  H/o asthma with need for refills on meds.  No recent use of either inhaler.  Neg covid test last night.  Some post nasal gtt.  Tried taking pseudophed w/o resolution.    BP elevated yesterday but not today.  More anxiety in the last few months.  Stressors d/w pt.  30 y/o and 9 m/o at home.  Working and taking college classes.  No SI/HI.  SOB after intercourse but not during the event.  He had been able to exercise at the gym w/o CP or SOB.    No smoking, very rare etoh, no illicits.  His wrist pain is better after prev injection.   Meds, vitals, and allergies reviewed.   ROS: Per HPI unless specifically indicated in ROS section   nad ncat TM wnl B  Nasal exam stuffy.   MMM Neck supple, no LA rrr ctab Abdomen soft. Skin well perfused. Speech and judgment intact.

## 2020-10-26 NOTE — Patient Instructions (Signed)
Restart inhalers.  Use flonase and use hydroxyzine if needed for anxiety. Update me as needed.  Take care.  Glad to see you.

## 2020-10-28 DIAGNOSIS — J45909 Unspecified asthma, uncomplicated: Secondary | ICD-10-CM | POA: Insufficient documentation

## 2020-10-28 NOTE — Assessment & Plan Note (Signed)
Discussed options.  Restart his inhalers with routine caution.  Unclear how much asthma is contributing to recent symptoms.  He can also use Flonase as needed for postnasal drip.  If he has residual allergic symptoms he can use hydroxyzine and that may also help with the anxiety.  Rationale for use discussed with patient.  He will update [place] as needed.  Okay for outpatient follow-up.

## 2021-05-29 ENCOUNTER — Ambulatory Visit: Payer: 59 | Admitting: Family Medicine

## 2021-05-29 ENCOUNTER — Encounter: Payer: Self-pay | Admitting: Family Medicine

## 2021-05-29 ENCOUNTER — Other Ambulatory Visit: Payer: Self-pay

## 2021-05-29 DIAGNOSIS — J0100 Acute maxillary sinusitis, unspecified: Secondary | ICD-10-CM

## 2021-05-29 DIAGNOSIS — K1379 Other lesions of oral mucosa: Secondary | ICD-10-CM | POA: Diagnosis not present

## 2021-05-29 DIAGNOSIS — J329 Chronic sinusitis, unspecified: Secondary | ICD-10-CM | POA: Insufficient documentation

## 2021-05-29 LAB — POCT RAPID STREP A (OFFICE): Rapid Strep A Screen: NEGATIVE

## 2021-05-29 MED ORDER — FLUTICASONE-SALMETEROL 250-50 MCG/ACT IN AEPB: 1.0000 | INHALATION_SPRAY | Freq: Two times a day (BID) | RESPIRATORY_TRACT | 1 refills | Status: DC

## 2021-05-29 MED ORDER — AZITHROMYCIN 250 MG PO TABS: ORAL_TABLET | ORAL | 0 refills | Status: DC

## 2021-05-29 NOTE — Assessment & Plan Note (Signed)
S/p uri early this month Now ongoing congestion with green mucous and now sinus pain/pressure  zithromax px to take as directed  Update if not starting to improve in a week or if worsening

## 2021-05-29 NOTE — Assessment & Plan Note (Signed)
This am without severe sore throat Also has a bacterial sinus infection  Also post nasal drip  No s/s of allergic rxn or anaphylaxis  Rapid strep test is negative  Will watch closely  Consider another course of prednisone if worse or no improvement

## 2021-05-29 NOTE — Patient Instructions (Signed)
Take the zithromax as directed  Drink fluids/cold Gargling with salt water helps some   For congestion  You can try nasal saline spray  Breathe steam   If the swollen uvula does not improve let us know If severe or trouble breathing go to the ER  Ibuprofen or aleve will help pain Clelia Schaumann Rudie Meyer throat /swelling   You can take 400 mg of ibuprofen every 6-8 hours With food  Lots of fluids

## 2021-05-29 NOTE — Progress Notes (Signed)
Subjective:    Patient ID: Gilbert Herrera, male    DOB: Oct 29, 1990, 30 y.o.   MRN: [id number]  This visit occurred during the SARS-CoV-2 public health emergency.  Safety protocols were in place, including screening questions prior to the visit, additional usage of staff PPE, and extensive cleaning of exam room while observing appropriate contact time as indicated for disinfecting solutions.   HPI 30 yo pt of NP [name] presents with a swollen uvula   Wt Readings from Last 3 Encounters:  05/29/21 246 lb 2 oz (111.6 kg)  10/26/20 253 lb (114.8 kg)  05/19/19 249 lb 8 oz (113.2 kg)   34.82 kg/m  He had a telemed visit on 12/6 for uri and given px for medrol dosepack for asthma and congestion   Was very hoarse for a week  Took mucinex also   Was better for a week   Woke up this am with swollen uvula This is new  Throat is just mildly sore  (was very sore about 8 days ago from cough) Felt like throat was healing  A little dry cough   No wheezing more than usual  Just went back to the gym   Had a headache 2 d ago- better now  Still has sinus congestion -moderate  Some sinus pressure  Nasal d/c is yellow to brown (had cleared and now dark again)  Now some blood also  Ears feel fine   No stridor  No croup [name] is not barking   Uses advair and does rinse mouth after Last use 3 d ago   No itching or rash  No new exposures or foods  No fever   Patient Active Problem List   Diagnosis Date Noted   Uvular swelling 05/29/2021   Sinusitis 05/29/2021   Asthma    Nodule of finger of left hand 05/19/2019   Acute pain of both knees 05/19/2019   BMI 32.0-32.9,adult 10/29/2016   Mild intermittent asthma without complication [id number]   Past Medical History:  Diagnosis Date   Asthma    History reviewed. No pertinent surgical history. Social History   Tobacco Use   Smoking status: Never   Smokeless tobacco: Never  Vaping Use   Vaping Use: Never used  Substance Use  Topics   Alcohol use: Yes    Comment: Socially   Drug use: No   Family History  Problem Relation Age of Onset   Hypertension Father    Heart attack Maternal Grandfather    Cancer Paternal Grandmother    Allergies  Allergen Reactions   Ceclor [Cefaclor]     rash   Current Outpatient Medications on File Prior to Visit  Medication Sig Dispense Refill   albuterol (VENTOLIN HFA) 108 (90 Base) MCG/ACT inhaler Inhale 1-2 puffs into the lungs every 6 (six) hours as needed for wheezing. 1 each 2   fluticasone (FLONASE) 50 MCG/ACT nasal spray Place 2 sprays into both nostrils daily. 16 g 6   hydrOXYzine (ATARAX/VISTARIL) 10 MG tablet Take 0.5-1 tablets (5-10 mg total) by mouth 3 (three) times daily as needed for anxiety. 30 tablet 0   meloxicam (MOBIC) 15 MG tablet Take 1 tablet (15 mg total) by mouth daily as needed for pain. 30 tablet 0   No current facility-administered medications on file prior to visit.     Review of Systems  Constitutional:  Positive for fever. Negative for activity change, appetite change, fatigue and unexpected weight change.  HENT:  Positive for congestion, postnasal drip,  sinus pain and sore throat. Negative for rhinorrhea, trouble swallowing and voice change.   Eyes:  Negative for pain, redness, itching and visual disturbance.  Respiratory:  Positive for cough. Negative for chest tightness, shortness of breath and wheezing.   Cardiovascular:  Negative for chest pain and palpitations.  Gastrointestinal:  Negative for abdominal pain, blood in stool, constipation, diarrhea and nausea.  Endocrine: Negative for cold intolerance, heat intolerance, polydipsia and polyuria.  Genitourinary:  Negative for difficulty urinating, dysuria, frequency and urgency.  Musculoskeletal:  Negative for arthralgias, joint swelling and myalgias.  Skin:  Negative for pallor and rash.  Neurological:  Negative for dizziness, tremors, weakness, numbness and headaches.  Hematological:   Negative for adenopathy. Does not bruise/bleed easily.  Psychiatric/Behavioral:  Negative for decreased concentration and dysphoric mood. The patient is not nervous/anxious.       Objective:   Physical Exam Constitutional:      General: He is not in acute distress.    Appearance: Normal appearance. He is obese. He is not ill-appearing.  HENT:     Head: Normocephalic and atraumatic.     Comments: Tenderness of maxillary and frontal sinus areas     Right Ear: Tympanic membrane, ear canal and external ear normal.     Left Ear: Tympanic membrane, ear canal and external ear normal.     Nose: Congestion present. No rhinorrhea.     Mouth/Throat:     Mouth: Mucous membranes are moist.     Pharynx: No oropharyngeal exudate or posterior oropharyngeal erythema.     Comments: Uvula is mildly swollen and injected  Throat otherwise appears normal except for post nasal drip and mild cobblestoning No ulcers or lesions  Cardiovascular:     Rate and Rhythm: Normal rate and regular rhythm.     Heart sounds: Normal heart sounds.  Pulmonary:     Effort: Pulmonary effort is normal. No respiratory distress.     Breath sounds: Normal breath sounds. No stridor. No wheezing, rhonchi or rales.  Musculoskeletal:     Cervical back: Normal range of motion and neck supple. No tenderness.  Lymphadenopathy:     Cervical: No cervical adenopathy.  Skin:    Coloration: Skin is not pale.     Findings: No erythema or rash.  Neurological:     General: No focal deficit present.     Mental Status: He is alert.     Cranial Nerves: No cranial nerve deficit.  Psychiatric:        Mood and Affect: Mood normal.          Assessment & Plan:   Problem List Items Addressed This Visit       Respiratory   Sinusitis    S/p uri early this month Now ongoing congestion with green mucous and now sinus pain/pressure  zithromax px to take as directed  Update if not starting to improve in a week or if worsening         Relevant Medications   azithromycin (ZITHROMAX Z-PAK) 250 MG tablet     Digestive   Uvular swelling    This am without severe sore throat Also has a bacterial sinus infection  Also post nasal drip  No s/s of allergic rxn or anaphylaxis  Rapid strep test is negative  Will watch closely  Consider another course of prednisone if worse or no improvement       Relevant Orders   POCT rapid strep A (Completed)

## 2022-05-15 ENCOUNTER — Ambulatory Visit
Admission: EM | Admit: 2022-05-15 | Discharge: 2022-05-15 | Disposition: A | Discharge status: Home / Self Care | Payer: 59 | Attending: Emergency Medicine | Admitting: Emergency Medicine

## 2022-05-15 DIAGNOSIS — B349 Viral infection, unspecified: Secondary | ICD-10-CM | POA: Insufficient documentation

## 2022-05-15 DIAGNOSIS — R058 Other specified cough: Secondary | ICD-10-CM | POA: Diagnosis not present

## 2022-05-15 DIAGNOSIS — J4520 Mild intermittent asthma, uncomplicated: Secondary | ICD-10-CM | POA: Diagnosis not present

## 2022-05-15 DIAGNOSIS — R519 Headache, unspecified: Secondary | ICD-10-CM | POA: Insufficient documentation

## 2022-05-15 DIAGNOSIS — Z7951 Long term (current) use of inhaled steroids: Secondary | ICD-10-CM | POA: Insufficient documentation

## 2022-05-15 DIAGNOSIS — R509 Fever, unspecified: Secondary | ICD-10-CM | POA: Diagnosis not present

## 2022-05-15 DIAGNOSIS — Z1152 Encounter for screening for COVID-19: Secondary | ICD-10-CM | POA: Insufficient documentation

## 2022-05-15 NOTE — ED Triage Notes (Signed)
Patient to Urgent Care with complaints of headache, nasal congestion, fever (unknown how high), chills, and productive cough. Symptoms started yesterday.   Reports he completed a telehealth visit today and was told to come to urgent care for a covid/ flu swab.

## 2022-05-15 NOTE — Discharge Instructions (Addendum)
Your COVID and Flu tests are pending.    Take Tylenol or ibuprofen as needed for fever or discomfort.  Rest and keep yourself hydrated.    Follow-up with your primary care provider if your symptoms are not improving.     

## 2022-05-15 NOTE — ED Provider Notes (Signed)
[facility]    CSN: [id number] Arrival date & time: 05/15/22  3474      History   Chief Complaint Chief Complaint  Patient presents with   Headache    HPI Gilbert Herrera is a 31 y.o. male.   Patient presents with fever, chills, body aches, headache, congestion, cough since yesterday.  He denies wheezing, shortness of breath, vomiting, diarrhea, or other symptoms.  Treatment at home with Mucinex.  His medical history includes asthma.   The history is provided by the patient and medical records.    Past Medical History:  Diagnosis Date   Asthma     Patient Active Problem List   Diagnosis Date Noted   Fever, unspecified 05/15/2022   Uvular swelling 05/29/2021   Sinusitis 05/29/2021   Asthma    Nodule of finger of left hand 05/19/2019   Acute pain of both knees 05/19/2019   BMI 32.0-32.9,adult 10/29/2016   Mild intermittent asthma without complication 10/29/2016    History reviewed. No pertinent surgical history.     Home Medications    Prior to Admission medications   Medication Sig Start Date End Date Taking? Authorizing Provider  albuterol (VENTOLIN HFA) 108 (90 Base) MCG/ACT inhaler Inhale 1-2 puffs into the lungs every 6 (six) hours as needed for wheezing. 10/26/20   Joaquim Nam, MD  azithromycin (ZITHROMAX Z-PAK) 250 MG tablet Take 2 pills by mouth today and then 1 pill daily for 4 days 05/29/21   Tower, Audrie Gallus, MD  fluticasone (FLONASE) 50 MCG/ACT nasal spray Place 2 sprays into both nostrils daily. 10/26/20   Joaquim Nam, MD  fluticasone-salmeterol (ADVAIR DISKUS) 250-50 MCG/ACT AEPB Inhale 1 puff into the lungs in the morning and at bedtime. Rinse after use. 05/29/21   Tower, Audrie Gallus, MD  hydrOXYzine (ATARAX/VISTARIL) 10 MG tablet Take 0.5-1 tablets (5-10 mg total) by mouth 3 (three) times daily as needed for anxiety. 10/26/20   Joaquim Nam, MD  meloxicam (MOBIC) 15 MG tablet Take 1 tablet (15 mg total) by mouth daily as needed for pain.  05/19/19   Gweneth Dimitri, MD    Family History Family History  Problem Relation Age of Onset   Hypertension Father    Heart attack Maternal Grandfather    Cancer Paternal Grandmother     Social History Social History   Tobacco Use   Smoking status: Never   Smokeless tobacco: Never  Vaping Use   Vaping Use: Never used  Substance Use Topics   Alcohol use: Yes    Comment: Socially   Drug use: No     Allergies   Ceclor [cefaclor]   Review of Systems Review of Systems  Constitutional:  Positive for chills and fever.  HENT:  Positive for congestion. Negative for ear pain and sore throat.   Respiratory:  Positive for cough. Negative for shortness of breath and wheezing.   Cardiovascular:  Negative for chest pain and palpitations.  Gastrointestinal:  Negative for diarrhea and vomiting.  Skin:  Negative for color change and rash.  Neurological:  Positive for headaches.  All other systems reviewed and are negative.    Physical Exam Triage Vital Signs ED Triage Vitals  Enc Vitals Group     BP      Pulse      Resp      Temp      Temp src      SpO2      Weight      Height  Head Circumference      Peak Flow      Pain Score      Pain Loc      Pain Edu?      Excl. in [place]?    No data found.  Updated Vital Signs BP 126/86   Pulse (!) 106   Temp 100.1 F (37.8 C)   Resp 18   Ht 5\' 11"  (1.803 m)   Wt 250 lb (113.4 kg)   SpO2 96%   BMI 34.87 kg/m   Visual Acuity Right Eye Distance:   Left Eye Distance:   Bilateral Distance:    Right Eye Near:   Left Eye Near:    Bilateral Near:     Physical Exam Vitals and nursing note reviewed.  Constitutional:      General: He is not in acute distress.    Appearance: Normal appearance. He is well-developed. He is not ill-appearing.  HENT:     Right Ear: Tympanic membrane normal.     Left Ear: Tympanic membrane normal.     Nose: Congestion present.     Mouth/Throat:     Mouth: Mucous membranes are moist.      Pharynx: Oropharynx is clear.  Eyes:     Conjunctiva/sclera: Conjunctivae normal.  Cardiovascular:     Rate and Rhythm: Normal rate and regular rhythm.     Heart sounds: Normal heart sounds.  Pulmonary:     Effort: Pulmonary effort is normal. No respiratory distress.     Breath sounds: Normal breath sounds. No wheezing.  Musculoskeletal:     Cervical back: Neck supple.  Skin:    General: Skin is warm and dry.  Neurological:     Mental Status: He is alert.  Psychiatric:        Mood and Affect: Mood normal.        Behavior: Behavior normal.      UC Treatments / Results  Labs (all labs ordered are listed, but only abnormal results are displayed) Labs Reviewed  RESP PANEL BY RT-PCR (FLU A&B, COVID) [id number]    EKG   Radiology No results found.  Procedures Procedures (including critical care time)  Medications Ordered in UC Medications - No data to display  Initial Impression / Assessment and Plan / UC Course  I have reviewed the triage vital signs and the nursing notes.  Pertinent labs & imaging results that were available during my care of the patient were reviewed by me and considered in my medical decision making (see chart for details).    Viral illness.  COVID and Flu pending.  Patient states he would not want antiviral treatment for COVID if he is positive.  Discussed symptomatic treatment including Tylenol or ibuprofen, rest, hydration.  Instructed patient to follow up with PCP if symptoms are not improving.  He agrees to plan of care.   Final Clinical Impressions(s) / UC Diagnoses   Final diagnoses:  Viral illness     Discharge Instructions      Your COVID and Flu tests are pending.    Take Tylenol or ibuprofen as needed for fever or discomfort.  Rest and keep yourself hydrated.    Follow-up with your primary care provider if your symptoms are not improving.         ED Prescriptions   None    PDMP not reviewed this encounter.   Sharion Balloon, NP 05/15/22 1945

## 2022-05-16 LAB — RESP PANEL BY RT-PCR (FLU A&B, COVID) ARPGX2
Influenza A by PCR: NEGATIVE
Influenza B by PCR: NEGATIVE
SARS Coronavirus 2 by RT PCR: NEGATIVE

## 2022-10-28 ENCOUNTER — Ambulatory Visit
Admission: EM | Admit: 2022-10-28 | Discharge: 2022-10-28 | Disposition: A | Discharge status: Home / Self Care | Payer: 59 | Attending: Urgent Care | Admitting: Urgent Care

## 2022-10-28 DIAGNOSIS — J0100 Acute maxillary sinusitis, unspecified: Secondary | ICD-10-CM

## 2022-10-28 DIAGNOSIS — H6991 Unspecified Eustachian tube disorder, right ear: Secondary | ICD-10-CM | POA: Diagnosis not present

## 2022-10-28 DIAGNOSIS — H9311 Tinnitus, right ear: Secondary | ICD-10-CM

## 2022-10-28 MED ORDER — PREDNISONE 50 MG PO TABS: 50.0000 mg | ORAL_TABLET | Freq: Every day | ORAL | 0 refills | Status: AC

## 2022-10-28 NOTE — ED Triage Notes (Signed)
Patient presents to UC for tinnitus in right ear x 1 day. Used OTC swimmers ear drops and pain relief drops with no changes to symptoms.

## 2022-10-28 NOTE — ED Provider Notes (Signed)
[name]    CSN: [id number] Arrival date & time: 10/28/22  1020      History   Chief Complaint Chief Complaint  Patient presents with   Tinnitus    HPI Gilbert Herrera is a 32 y.o. male.   HPI  Patient presents to urgent care with 1 day complaint of tinnitus in right ear.  He has been using OTC swimmers ear drops as well as pain relief drops without change in symptoms.  He also reports using a Q-tip and rubbing it against his TM which did not help his symptoms.  Patient works nights and states he experienced having a sneeze last night which was followed immediately by severe vertigo which lasted several minutes, preventing him from getting up from the seated position.  He states he feels as if his right ear needs to "pop".  Endorses URI symptoms with strep throat treated about 3 weeks ago with amoxicillin.  Past Medical History:  Diagnosis Date   Asthma     Patient Active Problem List   Diagnosis Date Noted   Fever, unspecified 05/15/2022   Uvular swelling 05/29/2021   Sinusitis 05/29/2021   Asthma    Nodule of finger of left hand 05/19/2019   Acute pain of both knees 05/19/2019   BMI 32.0-32.9,adult 10/29/2016   Mild intermittent asthma without complication 10/29/2016    History reviewed. No pertinent surgical history.     Home Medications    Prior to Admission medications   Medication Sig Start Date End Date Taking? Authorizing Provider  albuterol (VENTOLIN HFA) 108 (90 Base) MCG/ACT inhaler Inhale 1-2 puffs into the lungs every 6 (six) hours as needed for wheezing. 10/26/20   Joaquim Nam, MD  azithromycin (ZITHROMAX Z-PAK) 250 MG tablet Take 2 pills by mouth today and then 1 pill daily for 4 days 05/29/21   Tower, Audrie Gallus, MD  fluticasone (FLONASE) 50 MCG/ACT nasal spray Place 2 sprays into both nostrils daily. 10/26/20   Joaquim Nam, MD  fluticasone-salmeterol (ADVAIR DISKUS) 250-50 MCG/ACT AEPB Inhale 1 puff into the lungs in the morning  and at bedtime. Rinse after use. 05/29/21   Tower, Audrie Gallus, MD  hydrOXYzine (ATARAX/VISTARIL) 10 MG tablet Take 0.5-1 tablets (5-10 mg total) by mouth 3 (three) times daily as needed for anxiety. 10/26/20   Joaquim Nam, MD  meloxicam (MOBIC) 15 MG tablet Take 1 tablet (15 mg total) by mouth daily as needed for pain. 05/19/19   Gweneth Dimitri, MD    Family History Family History  Problem Relation Age of Onset   Hypertension Father    Heart attack Maternal Grandfather    Cancer Paternal Grandmother     Social History Social History   Tobacco Use   Smoking status: Never   Smokeless tobacco: Never  Vaping Use   Vaping Use: Never used  Substance Use Topics   Alcohol use: Yes    Comment: Socially   Drug use: No     Allergies   Ceclor [cefaclor]   Review of Systems Review of Systems   Physical Exam Triage Vital Signs ED Triage Vitals  Enc Vitals Group     BP 10/28/22 1043 123/89     Pulse Rate 10/28/22 1043 69     Resp 10/28/22 1043 18     Temp 10/28/22 1043 98.4 F (36.9 C)     Temp Source 10/28/22 1043 Oral     SpO2 10/28/22 1043 95 %     Weight --  Height --      Head Circumference --      Peak Flow --      Pain Score 10/28/22 1100 1     Pain Loc --      Pain Edu? --      Excl. in [place]? --    No data found.  Updated Vital Signs BP 123/89 (BP Location: Left Arm)   Pulse 69   Temp 98.4 F (36.9 C) (Oral)   Resp 18   SpO2 95%   Visual Acuity Right Eye Distance:   Left Eye Distance:   Bilateral Distance:    Right Eye Near:   Left Eye Near:    Bilateral Near:     Physical Exam Vitals reviewed.  Constitutional:      Appearance: Normal appearance.  HENT:     Head:     Comments: Right EAC is erythematous.  TM is otherwise WNL.    Right Ear: Tympanic membrane normal. Tympanic membrane is not erythematous.     Left Ear: Tympanic membrane normal. Tympanic membrane is not erythematous.     Nose: Nose normal. No congestion.  Skin:    General:  Skin is warm and dry.  Neurological:     General: No focal deficit present.     Mental Status: He is alert and oriented to person, place, and time.  Psychiatric:        Mood and Affect: Mood normal.      UC Treatments / Results  Labs (all labs ordered are listed, but only abnormal results are displayed) Labs Reviewed - No data to display  EKG   Radiology No results found.  Procedures Procedures (including critical care time)  Medications Ordered in UC Medications - No data to display  Initial Impression / Assessment and Plan / UC Course  I have reviewed the triage vital signs and the nursing notes.  Pertinent labs & imaging results that were available during my care of the patient were reviewed by me and considered in my medical decision making (see chart for details).   Gilbert Herrera is a 32 y.o. male presenting with R eustachian tube dysfunction. Patient is afebrile without recent antipyretics, satting well on room air. Overall is well appearing though non-toxic, well hydrated, without respiratory distress.  TMs are WNL bilaterally.  Right EAC is erythematous  Suspect right eustachian tube dysfunction secondary to sinusitis.  Treating with a course of prednisone to relieve sinusitis and restore function to the eustachian tube ordered.  Reviewed chart history.   Counseled patient on potential for adverse effects with medications prescribed/recommended today, ER and return-to-clinic precautions discussed, patient verbalized understanding and agreement with care plan.   Final Clinical Impressions(s) / UC Diagnoses   Final diagnoses:  None   Discharge Instructions   None    ED Prescriptions   None    PDMP not reviewed this encounter.   Charma Igo, [place] 10/28/22 1117

## 2022-10-28 NOTE — Discharge Instructions (Signed)
Follow up here or with your primary care provider if your symptoms are worsening or not improving with treatment.     

## 2022-11-06 ENCOUNTER — Telehealth: Payer: Self-pay | Admitting: Family Medicine

## 2022-11-06 NOTE — Telephone Encounter (Signed)
That is reasonable.

## 2022-11-06 NOTE — Telephone Encounter (Signed)
Pt called requesting to schedule a TOC appt & a appt to address is asthma issue, he's been experiencing. Pt states throughout the night, he believes he has asthma attacks. Pt states he hasn't visited a ER or UC for the issue. Scheduled pt with Dr. Patsy Lager on Mon, 6/3. Transferred pt to access nurse. Call back # [phone]  TOC scheduled with Dr. Clent Ridges at [facility] or 11/27/22.

## 2022-11-06 NOTE — Telephone Encounter (Signed)
Pt was transferred back to [facility] by access nurse for pt to be seen within 24 hrs disposition. Pt said last 3 nights awakened SOB when lays flat had cough last wk; has chest congestion;no wheezing; pt said now he is not having any breathing difficulty. No available appt at any [facility] office this afternoon and I offered pt appt at [facility] this afternoon at 4:45; pt said he could not do that because of cost of UC visit. I scheduled pt an appt on 11/07/22 at 11:40 with Vallery Ridge NP with UC & ED precautions given and pt voiced understanding.Sending note to Vallery Ridge NP.

## 2022-11-07 ENCOUNTER — Ambulatory Visit: Payer: 59 | Admitting: Nurse Practitioner

## 2022-11-07 ENCOUNTER — Telehealth: Payer: Self-pay

## 2022-11-07 VITALS — BP 126/82 | HR 84 | Temp 98.1°F | Ht 71.0 in | Wt 257.4 lb

## 2022-11-07 DIAGNOSIS — J4521 Mild intermittent asthma with (acute) exacerbation: Secondary | ICD-10-CM | POA: Diagnosis not present

## 2022-11-07 DIAGNOSIS — R0602 Shortness of breath: Secondary | ICD-10-CM | POA: Diagnosis not present

## 2022-11-07 DIAGNOSIS — F419 Anxiety disorder, unspecified: Secondary | ICD-10-CM

## 2022-11-07 LAB — CBC WITH DIFFERENTIAL/PLATELET
Basophils Absolute: 0 10*3/uL (ref 0.0–0.1)
Basophils Relative: 0.4 % (ref 0.0–3.0)
Eosinophils Absolute: 0.2 10*3/uL (ref 0.0–0.7)
Eosinophils Relative: 2.2 % (ref 0.0–5.0)
HCT: 47.5 % (ref 39.0–52.0)
Hemoglobin: 15.7 g/dL (ref 13.0–17.0)
Lymphocytes Relative: 27.7 % (ref 12.0–46.0)
Lymphs Abs: 2.5 10*3/uL (ref 0.7–4.0)
MCHC: 33.2 g/dL (ref 30.0–36.0)
MCV: 87.8 fl (ref 78.0–100.0)
Monocytes Absolute: 1.1 10*3/uL — ABNORMAL HIGH (ref 0.1–1.0)
Monocytes Relative: 11.6 % (ref 3.0–12.0)
Neutro Abs: 5.3 10*3/uL (ref 1.4–7.7)
Neutrophils Relative %: 58.1 % (ref 43.0–77.0)
Platelets: 182 10*3/uL (ref 150.0–400.0)
RBC: 5.41 Mil/uL (ref 4.22–5.81)
RDW: 13.8 % (ref 11.5–15.5)
WBC: 9.2 10*3/uL (ref 4.0–10.5)

## 2022-11-07 LAB — COMPREHENSIVE METABOLIC PANEL
ALT: 35 U/L (ref 0–53)
AST: 25 U/L (ref 0–37)
Albumin: 4.6 g/dL (ref 3.5–5.2)
Alkaline Phosphatase: 77 U/L (ref 39–117)
BUN: 14 mg/dL (ref 6–23)
CO2: 30 mEq/L (ref 19–32)
Calcium: 9.6 mg/dL (ref 8.4–10.5)
Chloride: 101 mEq/L (ref 96–112)
Creatinine, Ser: 1.39 mg/dL (ref 0.40–1.50)
GFR: 67.53 mL/min (ref >60.00)
Glucose, Bld: 91 mg/dL (ref 70–99)
Potassium: 4.2 mEq/L (ref 3.5–5.1)
Sodium: 139 mEq/L (ref 135–145)
Total Bilirubin: 0.6 mg/dL (ref 0.2–1.2)
Total Protein: 7.5 g/dL (ref 6.0–8.3)

## 2022-11-07 LAB — LIPID PANEL
Cholesterol: 214 mg/dL — ABNORMAL HIGH (ref 0–200)
HDL: 43.8 mg/dL (ref >39.00)
NonHDL: 169.8
Total CHOL/HDL Ratio: 5
Triglycerides: 209 mg/dL — ABNORMAL HIGH (ref 0.0–149.0)
VLDL: 41.8 mg/dL — ABNORMAL HIGH (ref 0.0–40.0)

## 2022-11-07 LAB — LDL CHOLESTEROL, DIRECT: Direct LDL: 148 mg/dL

## 2022-11-07 LAB — TSH: TSH: 2.36 u[IU]/mL (ref 0.35–5.50)

## 2022-11-07 MED ORDER — ALBUTEROL SULFATE HFA 108 (90 BASE) MCG/ACT IN AERS: 1.0000 | INHALATION_SPRAY | Freq: Four times a day (QID) | RESPIRATORY_TRACT | 2 refills | Status: DC | PRN

## 2022-11-07 MED ORDER — FLUTICASONE-SALMETEROL 250-50 MCG/ACT IN AEPB: 1.0000 | INHALATION_SPRAY | Freq: Two times a day (BID) | RESPIRATORY_TRACT | 1 refills | Status: DC

## 2022-11-07 NOTE — Telephone Encounter (Signed)
Medication sent tried to call in inform pt did not answer

## 2022-11-07 NOTE — Telephone Encounter (Signed)
Patient states we sent his prescription for fluticasone-salmeterol (ADVAIR DISKUS) 250-50 MCG/ACT AEPB to [facility] in [place], but they are out of stock and it will take a couple of days to get it.  Patient would like for [place] to please send this [facility] to [facility] on [address].

## 2022-11-07 NOTE — Patient Instructions (Addendum)
Labs ordered. Rx sent to pharmacy. Advised patient to call if the symptoms not getting better. ED/UC precaution discussed.

## 2022-11-07 NOTE — Progress Notes (Signed)
Established Patient Office Visit  Subjective:  Patient ID: Gilbert Herrera, male    DOB: 02-19-1991  Age: 32 y.o. MRN: [id number]  CC:  Chief Complaint  Patient presents with   Shortness of Breath    Started Monday and starts when he is trying to sleep he would also like to discuss the atarax medication that he no longer takes    HPI  Gilbert Herrera presents for SOB . Patient reports that he gets SOB around 3 am - 5 am. He states that he is working, in school an taking care of his kids at home. Sometimes he gets overwhelmed with his current situation. He has albuterol inhaler and its helping with his symptoms.   He was prescribed Advair in the past but he do not have any refill and was not taking the inhaler. He had  started  on as needed anxiety medication 2 years ago but he is not using any medication at present.    Shortness of Breath This is a new problem. The current episode started in the past 7 days. The problem occurs daily. The problem has been unchanged. The average episode lasts 45 minutes. Associated symptoms include headaches, a sore throat and sputum production. Pertinent negatives include no chest pain, ear pain, fever or wheezing. The symptoms are aggravated by lying flat. Associated symptoms comments: Chest tightness, cough during night.. The treatment provided mild relief. His past medical history is significant for asthma.     Past Medical History:  Diagnosis Date   Asthma     No past surgical history on file.  Family History  Problem Relation Age of Onset   Hypertension Father    Heart attack Maternal Grandfather    Cancer Paternal Grandmother     Social History   Socioeconomic History   Marital status: Married    Spouse name: Not on file   Number of children: Not on file   Years of education: Not on file   Highest education level: Associate degree: academic program  Occupational History   Not on file  Tobacco Use   Smoking status: Never    Smokeless tobacco: Never  Vaping Use   Vaping Use: Never used  Substance and Sexual Activity   Alcohol use: Yes    Comment: Socially   Drug use: No   Sexual activity: Not on file  Other Topics Concern   Not on file  Social History Narrative   Not on file   Social Determinants of Health   Financial Resource Strain: Medium Risk (11/07/2022)   Overall Financial Resource Strain (CARDIA)    Difficulty of Paying Living Expenses: Somewhat hard  Food Insecurity: No Food Insecurity (11/07/2022)   Hunger Vital Sign    Worried About Running Out of Food in the Last Year: Never true    Ran Out of Food in the Last Year: Never true  Transportation Needs: No Transportation Needs (11/07/2022)   PRAPARE - Administrator, Civil Service (Medical): No    Lack of Transportation (Non-Medical): No  Physical Activity: Insufficiently Active (11/07/2022)   Exercise Vital Sign    Days of Exercise per Week: 1 day    Minutes of Exercise per Session: 60 min  Stress: Stress Concern Present (11/07/2022)   [employer] of Occupational Health - Occupational Stress Questionnaire    Feeling of Stress : Rather much  Social Connections: Moderately Isolated (11/07/2022)   Social Connection and Isolation Panel [NHANES]    Frequency of Communication with  Friends and Family: More than three times a week    Frequency of Social Gatherings with Friends and Family: Once a week    Attends Religious Services: Never    Database administrator or Organizations: No    Attends Engineer, structural: Not on file    Marital Status: Married  Catering manager Violence: Not on file     Outpatient Medications Prior to Visit  Medication Sig Dispense Refill   fluticasone (FLONASE) 50 MCG/ACT nasal spray Place 2 sprays into both nostrils daily. 16 g 6   meloxicam (MOBIC) 15 MG tablet Take 1 tablet (15 mg total) by mouth daily as needed for pain. 30 tablet 0   albuterol (VENTOLIN HFA) 108 (90 Base) MCG/ACT  inhaler Inhale 1-2 puffs into the lungs every 6 (six) hours as needed for wheezing. 1 each 2   azithromycin (ZITHROMAX Z-PAK) 250 MG tablet Take 2 pills by mouth today and then 1 pill daily for 4 days 6 tablet 0   fluticasone-salmeterol (ADVAIR DISKUS) 250-50 MCG/ACT AEPB Inhale 1 puff into the lungs in the morning and at bedtime. Rinse after use. 60 each 1   hydrOXYzine (ATARAX/VISTARIL) 10 MG tablet Take 0.5-1 tablets (5-10 mg total) by mouth 3 (three) times daily as needed for anxiety. 30 tablet 0   No facility-administered medications prior to visit.    Allergies  Allergen Reactions   Ceclor [Cefaclor]     rash    ROS Review of Systems  Constitutional:  Negative for fever.  HENT:  Positive for sore throat. Negative for ear pain.   Respiratory:  Positive for sputum production and shortness of breath. Negative for wheezing.   Cardiovascular:  Negative for chest pain.  Neurological:  Positive for headaches.      Objective:    Physical Exam Constitutional:      Appearance: Normal appearance.  HENT:     Head: Normocephalic.     Right Ear: Tympanic membrane normal.     Left Ear: Tympanic membrane normal.     Nose: Nose normal.     Mouth/Throat:     Mouth: Mucous membranes are moist.  Cardiovascular:     Rate and Rhythm: Normal rate and regular rhythm.     Pulses: Normal pulses.     Heart sounds: Normal heart sounds.  Pulmonary:     Effort: Pulmonary effort is normal.     Breath sounds: Normal breath sounds. No stridor. No decreased breath sounds or wheezing.  Chest:     Chest wall: No mass or tenderness.  Abdominal:     General: Bowel sounds are normal.     Palpations: Abdomen is soft. There is no mass.     Tenderness: There is no abdominal tenderness.  Musculoskeletal:        General: No swelling.     Cervical back: Normal range of motion and neck supple.     Right lower leg: No edema.     Left lower leg: No edema.  Skin:    General: Skin is warm.     Findings:  No rash.  Neurological:     General: No focal deficit present.     Mental Status: He is alert and oriented to person, place, and time. Mental status is at baseline.  Psychiatric:        Mood and Affect: Mood normal.        Behavior: Behavior normal.        Thought Content: Thought content normal.  Judgment: Judgment normal.     BP 126/82   Pulse 84   Temp 98.1 F (36.7 C)   Ht 5\' 11"  (1.803 m)   Wt 257 lb 6.4 oz (116.8 kg)   SpO2 97%   BMI 35.90 kg/m  Wt Readings from Last 3 Encounters:  11/07/22 257 lb 6.4 oz (116.8 kg)  05/15/22 250 lb (113.4 kg)  05/29/21 246 lb 2 oz (111.6 kg)     Health Maintenance  Topic Date Due   HIV Screening  Never done   Hepatitis C Screening  Never done   COVID-19 Vaccine (3 - 2023-24 season) 02/07/2022   INFLUENZA VACCINE  01/08/2023   DTaP/Tdap/Td (8 - Td or Tdap) 10/30/2026   HPV VACCINES  Aged Out    There are no preventive care reminders to display for this patient.  Lab Results  Component Value Date   TSH 2.36 11/07/2022   Lab Results  Component Value Date   WBC 9.2 11/07/2022   HGB 15.7 11/07/2022   HCT 47.5 11/07/2022   MCV 87.8 11/07/2022   PLT 182.0 11/07/2022   Lab Results  Component Value Date   NA 139 11/07/2022   K 4.2 11/07/2022   CO2 30 11/07/2022   GLUCOSE 91 11/07/2022   BUN 14 11/07/2022   CREATININE 1.39 11/07/2022   BILITOT 0.6 11/07/2022   ALKPHOS 77 11/07/2022   AST 25 11/07/2022   ALT 35 11/07/2022   PROT 7.5 11/07/2022   ALBUMIN 4.6 11/07/2022   CALCIUM 9.6 11/07/2022   GFR 67.53 11/07/2022   Lab Results  Component Value Date   CHOL 214 (H) 11/07/2022   Lab Results  Component Value Date   HDL 43.80 11/07/2022   No results found for: "LDLCALC" Lab Results  Component Value Date   TRIG 209.0 (H) 11/07/2022   Lab Results  Component Value Date   CHOLHDL 5 11/07/2022   No results found for: "HGBA1C"    Assessment & Plan:  SOB (shortness of breath) Assessment & Plan: Vitals  reassuring. Started on Advair inhaler. No PFTs on file. If symptoms not improving will refer him to the pulmonology.   Labs ordered  Orders: -     CBC with Differential/Platelet -     Comprehensive metabolic panel -     Lipid panel -     TSH  Anxiety Assessment & Plan: GAD score 14. Will treat with hydroxyzine 25 mg as needed for anxiety. Advised patient to perform deep breathing exercises, meditation and journaling.  Orders: -     CBC with Differential/Platelet -     Comprehensive metabolic panel -     Lipid panel -     TSH  Mild intermittent asthma with acute exacerbation Assessment & Plan: Vitals reassuring no wheezing during auscultation. Advised patient to use maintenance inhaler Advair twice a day and albuterol every 4-6 hours as needed. Refill sent to the pharmacy.   Other orders -     Albuterol Sulfate HFA; Inhale 1-2 puffs into the lungs every 6 (six) hours as needed for wheezing.  Dispense: 1 each; Refill: 2 -     LDL cholesterol, direct -     hydrOXYzine Pamoate; Take 1 capsule (25 mg total) by mouth every 6 (six) hours as needed.  Dispense: 30 capsule; Refill: 0    Follow-up: Return in about 2 weeks (around 11/21/2022).   Kara Dies, NP

## 2022-11-08 ENCOUNTER — Encounter: Payer: Self-pay | Admitting: Nurse Practitioner

## 2022-11-08 MED ORDER — HYDROXYZINE PAMOATE 25 MG PO CAPS: 25.0000 mg | ORAL_CAPSULE | Freq: Four times a day (QID) | ORAL | 0 refills | Status: DC | PRN

## 2022-11-10 ENCOUNTER — Ambulatory Visit: Payer: 59 | Admitting: Family Medicine

## 2022-11-14 ENCOUNTER — Encounter: Payer: Self-pay | Admitting: Nurse Practitioner

## 2022-11-18 DIAGNOSIS — R0602 Shortness of breath: Secondary | ICD-10-CM | POA: Insufficient documentation

## 2022-11-18 DIAGNOSIS — F419 Anxiety disorder, unspecified: Secondary | ICD-10-CM | POA: Insufficient documentation

## 2022-11-18 NOTE — Assessment & Plan Note (Signed)
Vitals reassuring. Started on Advair inhaler. No PFTs on file. If symptoms not improving will refer him to the pulmonology.   Labs ordered

## 2022-11-18 NOTE — Assessment & Plan Note (Signed)
Vitals reassuring no wheezing during auscultation. Advised patient to use maintenance inhaler Advair twice a day and albuterol every 4-6 hours as needed. Refill sent to the pharmacy.

## 2022-11-18 NOTE — Assessment & Plan Note (Signed)
GAD score 14. Will treat with hydroxyzine 25 mg as needed for anxiety. Advised patient to perform deep breathing exercises, meditation and journaling.

## 2022-11-27 ENCOUNTER — Encounter: Payer: Self-pay | Admitting: Family Medicine

## 2022-11-27 ENCOUNTER — Ambulatory Visit: Payer: 59 | Admitting: Family Medicine

## 2022-11-27 VITALS — BP 116/68 | HR 89 | Temp 98.4°F | Ht 71.0 in | Wt 261.2 lb

## 2022-11-27 DIAGNOSIS — J4520 Mild intermittent asthma, uncomplicated: Secondary | ICD-10-CM

## 2022-11-27 DIAGNOSIS — F39 Unspecified mood [affective] disorder: Secondary | ICD-10-CM | POA: Diagnosis not present

## 2022-11-27 NOTE — Progress Notes (Signed)
SUBJECTIVE:   Chief Complaint  Patient presents with   Transfer of Care   HPI Presents to clinic to transfer care  No acute concerns today  Mood disorder Requesting refill for Atarax.  Has not previously seen therapist or psychiatrist.  No other medications used and has found atarax helpful.  Reports history of anxiety and was previously taking Atarax 5-10 mg TID prn.  Recently seen in clinic and was reordered at 25 mg TID for GAD 14. Stress has increased with being in school and having family to take care of.    Asthma Requesting refill for Advair.  Previous tobacco use in high school. Has recently restarted Advair for SOB and doing well.  Also has Albuterol inhaler for rescue therapy.     PERTINENT PMH / PSH: Mild intermittent Asthma Mood disorder  OBJECTIVE:  BP 116/68   Pulse 89   Temp 98.4 F (36.9 C) (Oral)   Ht 5\' 11"  (1.803 m)   Wt 261 lb 3.2 oz (118.5 kg)   SpO2 96%   BMI 36.43 kg/m    Physical Exam Constitutional:      General: He is not in acute distress.    Appearance: He is normal weight. He is not ill-appearing.  HENT:     Head: Normocephalic.     Right Ear: Tympanic membrane, ear canal and external ear normal.     Left Ear: Tympanic membrane, ear canal and external ear normal.  Eyes:     Conjunctiva/sclera: Conjunctivae normal.  Neck:     Thyroid: No thyromegaly or thyroid tenderness.  Cardiovascular:     Rate and Rhythm: Normal rate and regular rhythm.     Pulses: Normal pulses.     Heart sounds: Normal heart sounds.  Pulmonary:     Effort: Pulmonary effort is normal.     Breath sounds: Normal breath sounds.  Abdominal:     General: Bowel sounds are normal.  Musculoskeletal:        General: Normal range of motion.  Lymphadenopathy:     Cervical: No cervical adenopathy.  Neurological:     Mental Status: He is alert. Mental status is at baseline.  Psychiatric:        Mood and Affect: Mood normal.        Behavior: Behavior normal.         Thought Content: Thought content normal.        Judgment: Judgment normal.       11/27/2022    1:45 PM 11/07/2022   11:55 AM 10/29/2016    1:42 PM  Depression screen PHQ 2/9  Decreased Interest 0 0 0  Down, Depressed, Hopeless 0 0 0  PHQ - 2 Score 0 0 0  Altered sleeping 1 3   Tired, decreased energy 2 3   Change in appetite 2 1   Feeling bad or failure about yourself  0 0   Trouble concentrating 0 0   Moving slowly or fidgety/restless 0 0   Suicidal thoughts 0 0   PHQ-9 Score 5 7   Difficult doing work/chores Not difficult at all Somewhat difficult        11/27/2022    1:46 PM 11/07/2022   11:56 AM 10/26/2020   11:53 AM  GAD 7 : Generalized Anxiety Score  Nervous, Anxious, on Edge 1 3 2   Control/stop worrying 0 3 2  Worry too much - different things 0 3 1  Trouble relaxing 1 3 1   Restless 0 0 0  Easily annoyed or irritable 2 1 2   Afraid - awful might happen 0 1 1  Total GAD 7 Score 4 14 9   Anxiety Difficulty Not difficult at all Very difficult      ASSESSMENT/PLAN:  Mood disorder [facility]) Assessment & Plan: Chronic.  GAD/PHQ9 decreased. Atarax recently restarted at  mg TID and doing well. Denies SI/HI. Decrease Atarax 25 mg TID prn from [facility] Encouraged CBT Mental Health resources provided Follow up as needed   Orders: -     hydrOXYzine Pamoate; Take 1 capsule (25 mg total) by mouth every 8 (eight) hours as needed.  Dispense: 30 capsule; Refill: 1  Mild intermittent asthma without complication Assessment & Plan: Chronic Doing well Refill Advair 1 puff BID Refill Albuterol 1-2 puffs q6h as needed for SOB/wheezing Follow up as needed   Orders: -     Fluticasone-Salmeterol; Inhale 1 puff into the lungs in the morning and at bedtime. Rinse after use.  Dispense: 60 each; Refill: 11 -     Albuterol Sulfate HFA; Inhale 1-2 puffs into the lungs every 6 (six) hours as needed for wheezing.  Dispense: 6.7 g; Refill: 2   PDMP reviewed  Return if symptoms worsen or fail  to improve, for PCP.  Dana Allan, MD

## 2022-11-27 NOTE — Patient Instructions (Addendum)
It was a pleasure meeting you today. Thank you for allowing me to take part in your health care.  Our goals for today as we discussed include:  Continue Advair 1 puff daily  Continue Atarax 25 mg as needed for anxety  [facility]  [address]  [place] [address] [phone]    Follow up for as needed  If you have any questions or concerns, please do not hesitate to call the office at [phone].  I look forward to our next visit and until then take care and stay safe.  Regards,   Dana Allan, MD   [facility]

## 2022-12-28 ENCOUNTER — Encounter: Payer: Self-pay | Admitting: Family Medicine

## 2022-12-28 DIAGNOSIS — F39 Unspecified mood [affective] disorder: Secondary | ICD-10-CM | POA: Insufficient documentation

## 2022-12-28 MED ORDER — ALBUTEROL SULFATE HFA 108 (90 BASE) MCG/ACT IN AERS: 1.0000 | INHALATION_SPRAY | Freq: Four times a day (QID) | RESPIRATORY_TRACT | 2 refills | Status: DC | PRN

## 2022-12-28 MED ORDER — HYDROXYZINE PAMOATE 25 MG PO CAPS: 25.0000 mg | ORAL_CAPSULE | Freq: Three times a day (TID) | ORAL | 1 refills | Status: AC | PRN

## 2022-12-28 MED ORDER — HYDROXYZINE PAMOATE 25 MG PO CAPS: 25.0000 mg | ORAL_CAPSULE | Freq: Four times a day (QID) | ORAL | 1 refills | Status: DC | PRN

## 2022-12-28 MED ORDER — FLUTICASONE-SALMETEROL 250-50 MCG/ACT IN AEPB: 1.0000 | INHALATION_SPRAY | Freq: Two times a day (BID) | RESPIRATORY_TRACT | 11 refills | Status: DC

## 2022-12-28 NOTE — Assessment & Plan Note (Signed)
Chronic.  GAD/PHQ9 decreased. Atarax recently restarted at  mg TID and doing well. Denies SI/HI. Decrease Atarax 25 mg TID prn from [facility] Encouraged CBT Mental Health resources provided Follow up as needed

## 2022-12-28 NOTE — Assessment & Plan Note (Signed)
Chronic Doing well Refill Advair 1 puff BID Refill Albuterol 1-2 puffs q6h as needed for SOB/wheezing Follow up as needed

## 2023-03-27 ENCOUNTER — Encounter: Payer: Self-pay | Admitting: Family Medicine

## 2023-04-06 NOTE — Telephone Encounter (Signed)
Added to allergies

## 2023-08-03 ENCOUNTER — Encounter: Payer: Self-pay | Admitting: Family Medicine

## 2023-08-03 ENCOUNTER — Ambulatory Visit: Payer: 59 | Admitting: Family Medicine

## 2023-08-03 VITALS — BP 102/62 | HR 75 | Temp 98.2°F | Resp 18 | Ht 71.0 in | Wt 257.1 lb

## 2023-08-03 DIAGNOSIS — F39 Unspecified mood [affective] disorder: Secondary | ICD-10-CM | POA: Diagnosis not present

## 2023-08-03 DIAGNOSIS — E538 Deficiency of other specified B group vitamins: Secondary | ICD-10-CM

## 2023-08-03 DIAGNOSIS — R002 Palpitations: Secondary | ICD-10-CM

## 2023-08-03 DIAGNOSIS — E785 Hyperlipidemia, unspecified: Secondary | ICD-10-CM

## 2023-08-03 DIAGNOSIS — J4520 Mild intermittent asthma, uncomplicated: Secondary | ICD-10-CM | POA: Diagnosis not present

## 2023-08-03 DIAGNOSIS — Z114 Encounter for screening for human immunodeficiency virus [HIV]: Secondary | ICD-10-CM

## 2023-08-03 DIAGNOSIS — E559 Vitamin D deficiency, unspecified: Secondary | ICD-10-CM

## 2023-08-03 DIAGNOSIS — R0789 Other chest pain: Secondary | ICD-10-CM

## 2023-08-03 DIAGNOSIS — Z1159 Encounter for screening for other viral diseases: Secondary | ICD-10-CM

## 2023-08-03 LAB — COMPREHENSIVE METABOLIC PANEL
ALT: 27 U/L (ref 0–53)
AST: 20 U/L (ref 0–37)
Albumin: 4.7 g/dL (ref 3.5–5.2)
Alkaline Phosphatase: 73 U/L (ref 39–117)
BUN: 16 mg/dL (ref 6–23)
CO2: 24 meq/L (ref 19–32)
Calcium: 9.4 mg/dL (ref 8.4–10.5)
Chloride: 106 meq/L (ref 96–112)
Creatinine, Ser: 1.27 mg/dL (ref 0.40–1.50)
GFR: 74.88 mL/min (ref >60.00)
Glucose, Bld: 95 mg/dL (ref 70–99)
Potassium: 4.3 meq/L (ref 3.5–5.1)
Sodium: 140 meq/L (ref 135–145)
Total Bilirubin: 0.4 mg/dL (ref 0.2–1.2)
Total Protein: 7.7 g/dL (ref 6.0–8.3)

## 2023-08-03 LAB — LIPID PANEL
Cholesterol: 209 mg/dL — ABNORMAL HIGH (ref 0–200)
HDL: 41.7 mg/dL (ref >39.00)
LDL Cholesterol: 144 mg/dL — ABNORMAL HIGH (ref 0–99)
NonHDL: 167.49
Total CHOL/HDL Ratio: 5
Triglycerides: 119 mg/dL (ref 0.0–149.0)
VLDL: 23.8 mg/dL (ref 0.0–40.0)

## 2023-08-03 LAB — CBC WITH DIFFERENTIAL/PLATELET
Basophils Absolute: 0 10*3/uL (ref 0.0–0.1)
Basophils Relative: 0.5 % (ref 0.0–3.0)
Eosinophils Absolute: 0.1 10*3/uL (ref 0.0–0.7)
Eosinophils Relative: 1.5 % (ref 0.0–5.0)
HCT: 48.6 % (ref 39.0–52.0)
Hemoglobin: 16.1 g/dL (ref 13.0–17.0)
Lymphocytes Relative: 29.9 % (ref 12.0–46.0)
Lymphs Abs: 2 10*3/uL (ref 0.7–4.0)
MCHC: 33.2 g/dL (ref 30.0–36.0)
MCV: 89.2 fL (ref 78.0–100.0)
Monocytes Absolute: 0.7 10*3/uL (ref 0.1–1.0)
Monocytes Relative: 9.8 % (ref 3.0–12.0)
Neutro Abs: 3.9 10*3/uL (ref 1.4–7.7)
Neutrophils Relative %: 58.3 % (ref 43.0–77.0)
Platelets: 198 10*3/uL (ref 150.0–400.0)
RBC: 5.45 Mil/uL (ref 4.22–5.81)
RDW: 13.4 % (ref 11.5–15.5)
WBC: 6.6 10*3/uL (ref 4.0–10.5)

## 2023-08-03 LAB — VITAMIN D 25 HYDROXY (VIT D DEFICIENCY, FRACTURES): VITD: 17 ng/mL — ABNORMAL LOW (ref 30.00–100.00)

## 2023-08-03 LAB — VITAMIN B12: Vitamin B-12: 193 pg/mL — ABNORMAL LOW (ref 211–911)

## 2023-08-03 LAB — TSH: TSH: 1.55 u[IU]/mL (ref 0.35–5.50)

## 2023-08-03 NOTE — Progress Notes (Signed)
 SUBJECTIVE:   Chief Complaint  Patient presents with   Anxiety   Asthma   HPI Presents for acute visit  Discussed the use of AI scribe software for clinical note transcription with the patient, who gave verbal consent to proceed.  History of Present Illness Gilbert Herrera is a 33 year old male with asthma who presents with chest tightness and palpitations.  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 This sensation makes it difficult to breathe, requiring deep breaths to alleviate the discomfort. He used his albuterol inhaler two to three times on Friday to help open his lungs. No shortness of breath with physical activity, such as climbing stairs, and no dizziness or skipped heartbeats. He notes a dull pain on the inside of his chest and increased anxiety. His heart rate during palpitations ranges from 85 to 105 bpm, with blood pressure readings reaching 131/85. After calming himself with breathing exercises, his blood pressure decreased to 122/75 and his pulse to 77 bpm.  He has not been using any preventative asthma medications like Epsolay for the past three to four months due to cost concerns. He resumed using Wixela (Advair) on Friday and plans to continue regularly. He used his albuterol inhaler on Friday due to chest tightness and shortness of breath. Albuterol can cause increased heart rate and jitters, which may contribute to symptoms.  He has not been using hydroxyzine for anxiety for the past two months due to concerns about its effects on heart rate. He typically drinks one to two cups of coffee daily and has been experiencing increased anxiety due to job hunting. He has been working out for the past month and a half, which he believes has helped lower his resting heart rate. His diet is inconsistent and he was likely dehydrated on Friday, which he attempted to correct by increasing his  water intake.    PERTINENT PMH / PSH: As above  OBJECTIVE:  BP 102/62   Pulse 75   Temp 98.2 F (36.8 C)   Resp 18   Ht 5\' 11"  (1.803 m)   Wt 257 lb 2 oz (116.6 kg)   SpO2 95%   BMI 35.86 kg/m    Physical Exam Constitutional:      General: He is not in acute distress.    Appearance: He is normal weight. He is not ill-appearing.  HENT:     Head: Normocephalic.  Eyes:     Conjunctiva/sclera: Conjunctivae normal.  Cardiovascular:     Rate and Rhythm: Normal rate and regular rhythm.     Pulses: Normal pulses.  Pulmonary:     Effort: Pulmonary effort is normal.     Breath sounds: Normal breath sounds.  Neurological:     Mental Status: He is alert. Mental status is at baseline.  Psychiatric:        Attention and Perception: Attention normal.        Mood and Affect: Mood is anxious.        Speech: Speech normal.        Behavior: Behavior normal.        Thought Content: Thought content normal.        Judgment: Judgment normal.           08/03/2023   11:39 AM 11/27/2022    1:45 PM 11/07/2022   11:55 AM 10/29/2016    1:42 PM  Depression screen PHQ 2/9  Decreased Interest 0 0 0 0  Down, Depressed, Hopeless 0 0 0 0  PHQ - 2 Score 0 0 0 0  Altered sleeping 0 1 3   Tired, decreased energy 0 2 3   Change in appetite 0 2 1   Feeling bad or failure about yourself  0 0 0   Trouble concentrating 0 0 0   Moving slowly or fidgety/restless 0 0 0   Suicidal thoughts 0 0 0   PHQ-9 Score 0 5 7   Difficult doing work/chores Not difficult at all Not difficult at all Somewhat difficult       08/03/2023   11:39 AM 11/27/2022    1:46 PM 11/07/2022   11:56 AM 10/26/2020   11:53 AM  GAD 7 : Generalized Anxiety Score  Nervous, Anxious, on Edge 2 1 3 2   Control/stop worrying 0 0 3 2  Worry too much - different things 0 0 3 1  Trouble relaxing 3 1 3 1   Restless 0 0 0 0  Easily annoyed or irritable 1 2 1 2   Afraid - awful might happen 0 0 1 1  Total GAD 7 Score 6 4 14 9   Anxiety  Difficulty Somewhat difficult Not difficult at all Very difficult     ASSESSMENT/PLAN:  Heart palpitations Assessment & Plan: Has had intermittent heart palpitations recurring more frequently.  Relieved after meditation and thought to be related to anxiety.  No associated cardiac pain.  Benign Cardiac exam today -Offered Zio monitor, patient declined.  -Offered SSRI for anxiety. Patient declined -Had not taken Atarax as previously prescribed for similar symptoms. Recommend to restart. -Avoid caffeine products -If continues to patient will consider monitoring at next visit. -Strict return precautions provided  Orders: -     CBC with Differential/Platelet -     Comprehensive metabolic panel -     TSH  Mood disorder (HCC) Assessment & Plan: Patient reports increased anxiety, potentially contributing to palpitations and chest discomfort. The patient has been prescribed Hydroxyzine but has not been using it regularly. -Encourage patient to resume use of Hydroxyzine for anxiety management. -Consider prescribing an SSRI for anxiety if symptoms persist or worsen. -Advise patient on the benefits of therapy, meditation, and other non-pharmacological anxiety management strategies. -Mental health resources provided.   Mild intermittent asthma without complication Assessment & Plan: Patient has been off preventative inhaler for several months due to cost. The patient has been using Ventolin (Albuterol) inhaler as needed. The patient reports a sensation of a "catch" in the left side of the chest, associated with shortness of breath. Has Advair but not using.  Lungs clear on exam today and no chest pain. -Encourage patient to resume use of preventative inhaler if possible. -Resume Advair as prescribed.    Need for hepatitis C screening test -     Hepatitis C antibody  Encounter for screening for HIV -     HIV Antibody (routine testing w rflx)  Vitamin D deficiency -     VITAMIN D 25  Hydroxy (Vit-D Deficiency, Fractures) -     Vitamin D (Ergocalciferol); Take 1 capsule (50,000 Units total) by mouth every 7 (seven) days.  Dispense: 12 capsule; Refill: 1  Vitamin B 12 deficiency -     Vitamin B12 -     Vitamin B-12; Take 2 tablets (2,000 mcg total) by mouth daily.  Dispense: 90 tablet; Refill: 3  Hyperlipidemia, unspecified hyperlipidemia type Assessment & Plan: Patient reports high cholesterol and triglycerides. -Order labs  to assess current lipid levels. -Discuss lifestyle modifications and potential need for medication management at follow-up appointment.   Orders: -     Lipid panel  Atypical chest pain Assessment & Plan: Patient reports a sensation of a "catch" in the left side of the chest, associated with shortness of breath and a dull pain. The patient has a history of asthma and has been off preventative inhaler (Epsolay) for several months due to cost. The patient has been using Ventolin (Albuterol) inhaler as needed. The patient also reports palpitations, with a heart rate ranging from 85 to 105 at rest. The patient has a family history of early heart attacks (grandfather at age 72). The patient's physical exam was unremarkable with normal heart sounds and no lung abnormalities. -Order labs to rule out anemia and other potential causes of palpitations. -Consider ordering a Zio patch for continuous heart monitoring if symptoms persist or worsen. -Encourage patient to resume use of Hydroxyzine for anxiety management. -Advise patient to monitor heart rate, particularly if it consistently stays above 150 at rest. -Schedule follow-up appointment in a few weeks to reassess symptoms and discuss lab results.     PDMP reviewed  Return in about 17 days (around 08/20/2023) for PCP.  Dana Allan, MD

## 2023-08-03 NOTE — Patient Instructions (Addendum)
 It was a pleasure meeting you today. Thank you for allowing me to take part in your health care.  Our goals for today as we discussed include:  We will get some labs today.  If they are abnormal or we need to do something about them, I will call you.  If they are normal, I will send you a message on MyChart (if it is active) or a letter in the mail.  If you don't hear from [place] in 2 weeks, please call the office at the number below.   Consider Zio monitor.  A 2 week heart monitor to measure heart rate 24 hours.  Avoid caffeine products  Restart Atarax for anxiety  [facility]  [address]  [place] [address] [phone]    [email or web address] [address], [place] [phone]    This is a list of the screening recommended for you and due dates:  Health Maintenance  Topic Date Due   Pneumococcal Vaccination (1 of 2 - PCV) Never done   HIV Screening  Never done   Hepatitis C Screening  Never done   Flu Shot  01/08/2023   COVID-19 Vaccine (3 - 2024-25 season) 02/08/2023   DTaP/Tdap/Td vaccine (8 - Td or Tdap) 10/30/2026   HPV Vaccine  Aged Out      If you have any questions or concerns, please do not hesitate to call the office at [phone].  I look forward to our next visit and until then take care and stay safe.  Regards,   Dana Allan, MD   [facility]

## 2023-08-04 LAB — HEPATITIS C ANTIBODY: Hepatitis C Ab: NONREACTIVE

## 2023-08-04 LAB — HIV ANTIBODY (ROUTINE TESTING W REFLEX): HIV 1&2 Ab, 4th Generation: NONREACTIVE

## 2023-08-09 ENCOUNTER — Encounter: Payer: Self-pay | Admitting: Family Medicine

## 2023-08-09 DIAGNOSIS — R002 Palpitations: Secondary | ICD-10-CM | POA: Insufficient documentation

## 2023-08-09 DIAGNOSIS — Z1159 Encounter for screening for other viral diseases: Secondary | ICD-10-CM | POA: Insufficient documentation

## 2023-08-09 DIAGNOSIS — E538 Deficiency of other specified B group vitamins: Secondary | ICD-10-CM | POA: Insufficient documentation

## 2023-08-09 DIAGNOSIS — Z114 Encounter for screening for human immunodeficiency virus [HIV]: Secondary | ICD-10-CM | POA: Insufficient documentation

## 2023-08-09 DIAGNOSIS — R0789 Other chest pain: Secondary | ICD-10-CM | POA: Insufficient documentation

## 2023-08-09 DIAGNOSIS — E559 Vitamin D deficiency, unspecified: Secondary | ICD-10-CM | POA: Insufficient documentation

## 2023-08-09 DIAGNOSIS — E785 Hyperlipidemia, unspecified: Secondary | ICD-10-CM | POA: Insufficient documentation

## 2023-08-09 MED ORDER — VITAMIN D (ERGOCALCIFEROL) 1.25 MG (50000 UNIT) PO CAPS: 50000.0000 [IU] | ORAL_CAPSULE | ORAL | 1 refills | Status: DC

## 2023-08-09 MED ORDER — VITAMIN B-12 1000 MCG PO TABS: 2000.0000 ug | ORAL_TABLET | Freq: Every day | ORAL | 3 refills | Status: AC

## 2023-08-09 NOTE — Assessment & Plan Note (Addendum)
 Patient reports increased anxiety, potentially contributing to palpitations and chest discomfort. The patient has been prescribed Hydroxyzine but has not been using it regularly. -Encourage patient to resume use of Hydroxyzine for anxiety management. -Consider prescribing an SSRI for anxiety if symptoms persist or worsen. -Advise patient on the benefits of therapy, meditation, and other non-pharmacological anxiety management strategies. -Mental health resources provided.

## 2023-08-09 NOTE — Assessment & Plan Note (Signed)
 Patient has been off preventative inhaler for several months due to cost. The patient has been using Ventolin (Albuterol) inhaler as needed. The patient reports a sensation of a "catch" in the left side of the chest, associated with shortness of breath. Has Advair but not using.  Lungs clear on exam today and no chest pain. -Encourage patient to resume use of preventative inhaler if possible. -Resume Advair as prescribed.

## 2023-08-09 NOTE — Assessment & Plan Note (Addendum)
 Has had intermittent heart palpitations recurring more frequently.  Relieved after meditation and thought to be related to anxiety.  No associated cardiac pain.  Benign Cardiac exam today -Offered Zio monitor, patient declined.  -Offered SSRI for anxiety. Patient declined -Had not taken Atarax as previously prescribed for similar symptoms. Recommend to restart. -Avoid caffeine products -If continues to patient will consider monitoring at next visit. -Strict return precautions provided

## 2023-08-09 NOTE — Assessment & Plan Note (Signed)
 Patient reports a sensation of a "catch" in the left side of the chest, associated with shortness of breath and a dull pain. The patient has a history of asthma and has been off preventative inhaler (Epsolay) for several months due to cost. The patient has been using Ventolin (Albuterol) inhaler as needed. The patient also reports palpitations, with a heart rate ranging from 85 to 105 at rest. The patient has a family history of early heart attacks (grandfather at age 41). The patient's physical exam was unremarkable with normal heart sounds and no lung abnormalities. -Order labs to rule out anemia and other potential causes of palpitations. -Consider ordering a Zio patch for continuous heart monitoring if symptoms persist or worsen. -Encourage patient to resume use of Hydroxyzine for anxiety management. -Advise patient to monitor heart rate, particularly if it consistently stays above 150 at rest. -Schedule follow-up appointment in a few weeks to reassess symptoms and discuss lab results.

## 2023-08-09 NOTE — Assessment & Plan Note (Signed)
 Patient reports high cholesterol and triglycerides. -Order labs to assess current lipid levels. -Discuss lifestyle modifications and potential need for medication management at follow-up appointment.

## 2023-08-11 ENCOUNTER — Encounter: Payer: 59 | Admitting: Family Medicine

## 2023-08-20 ENCOUNTER — Encounter: Payer: Self-pay | Admitting: Family Medicine

## 2023-08-20 ENCOUNTER — Ambulatory Visit (INDEPENDENT_AMBULATORY_CARE_PROVIDER_SITE_OTHER): Admitting: Family Medicine

## 2023-08-20 ENCOUNTER — Encounter: Payer: 59 | Admitting: Family Medicine

## 2023-08-20 VITALS — BP 124/72 | HR 69 | Temp 98.0°F | Ht 71.0 in | Wt 259.0 lb

## 2023-08-20 DIAGNOSIS — F39 Unspecified mood [affective] disorder: Secondary | ICD-10-CM

## 2023-08-20 DIAGNOSIS — E538 Deficiency of other specified B group vitamins: Secondary | ICD-10-CM

## 2023-08-20 DIAGNOSIS — E782 Mixed hyperlipidemia: Secondary | ICD-10-CM

## 2023-08-20 DIAGNOSIS — E559 Vitamin D deficiency, unspecified: Secondary | ICD-10-CM

## 2023-08-20 DIAGNOSIS — Z0000 Encounter for general adult medical examination without abnormal findings: Secondary | ICD-10-CM | POA: Diagnosis not present

## 2023-08-20 MED ORDER — VITAMIN D (ERGOCALCIFEROL) 1.25 MG (50000 UNIT) PO CAPS: 50000.0000 [IU] | ORAL_CAPSULE | ORAL | 3 refills | Status: AC

## 2023-08-20 NOTE — Patient Instructions (Addendum)
 It was a pleasure meeting you today. Thank you for allowing me to take part in your health care.  Our goals for today as we discussed include:  Glad you are feeling better  Continue Vitamin B 2000 mcg daily.  Dissolvable tablets or solution preferred.  Can be purchased on [employer] or at pharmacy.  If not the tablets will be ok  Continue Vitamin D 1.25 mg weekly   This is a list of the screening recommended for you and due dates:  Health Maintenance  Topic Date Due   Pneumococcal Vaccination (1 of 2 - PCV) Never done   COVID-19 Vaccine (3 - 2024-25 season) 02/08/2023   Flu Shot  09/07/2023*   DTaP/Tdap/Td vaccine (8 - Td or Tdap) 10/30/2026   Hepatitis C Screening  Completed   HIV Screening  Completed   HPV Vaccine  Aged Out  *Topic was postponed. The date shown is not the original due date.    If you have any questions or concerns, please do not hesitate to call the office at [phone].  I look forward to our next visit and until then take care and stay safe.  Regards,   Dana Allan, MD   [facility]

## 2023-08-20 NOTE — Progress Notes (Signed)
 SUBJECTIVE:   Chief Complaint  Patient presents with   Annual Exam   HPI Presents for annual physical   Discussed the use of AI scribe software for clinical note transcription with the patient, who gave verbal consent to proceed.  History of Present Illness The patient presents for an annual physical exam.  No chest pain, shortness of breath, constipation, diarrhea, or swelling. He has taken a break from the gym to rest, particularly his chest, but acknowledges this is not beneficial for his weight. He has not needed refills for Flonase or albuterol and has not filled his Advair prescription yet. He has enough Flonase if needed.  He experiences anxiety, which he attributes to his ongoing job search. He has used hydroxyzine once since the last visit, finding it helpful but causing drowsiness. He is not keen on starting an SSRI due to concerns about discontinuation effects.  He has a significant vitamin D deficiency, contributing to fatigue and difficulty focusing. He attributes his tiredness and difficulty focusing to low vitamin D levels and poor sleep over the past few weeks.  He has a low vitamin B12 level and has been taking a multivitamin with 2000 mcg of B12. He has not started B12 injections but is considering dissolvable supplements for better absorption.  His cholesterol was slightly elevated at 144 mg/dL. He acknowledges that his diet may contribute to this and has received guidance on dietary changes and exercise to help manage cholesterol levels. He has been on night shifts for seven years, which he feels has negatively impacted his health and sleep patterns.  He is generally up to date with vaccinations, except for the flu vaccine, which he has not received in recent years. He has two children, aged three and six, and his wife works from home doing medical billing. He has been managing childcare and work, often getting only six hours of sleep per night for the past four  years.    Review of Systems - Negative except listed above     PERTINENT PMH / PSH: As above  OBJECTIVE:  BP 124/72   Pulse 69   Temp 98 F (36.7 C)   Ht 5\' 11"  (1.803 m)   Wt 259 lb (117.5 kg)   SpO2 96%   BMI 36.12 kg/m    Physical Exam Vitals reviewed.  Constitutional:      Appearance: He is obese.  HENT:     Head: Normocephalic.     Right Ear: Tympanic membrane, ear canal and external ear normal.     Left Ear: Tympanic membrane, ear canal and external ear normal.     Nose: Nose normal.     Mouth/Throat:     Mouth: Mucous membranes are moist.  Eyes:     Conjunctiva/sclera: Conjunctivae normal.     Pupils: Pupils are equal, round, and reactive to light.  Neck:     Thyroid: No thyromegaly or thyroid tenderness.     Vascular: No carotid bruit.  Cardiovascular:     Rate and Rhythm: Normal rate and regular rhythm.     Pulses: Normal pulses.     Heart sounds: Normal heart sounds.  Pulmonary:     Effort: Pulmonary effort is normal.     Breath sounds: Normal breath sounds.  Abdominal:     General: Bowel sounds are normal.     Palpations: Abdomen is soft.  Musculoskeletal:        General: Normal range of motion.     Cervical back: Normal  range of motion and neck supple.     Right lower leg: No edema.     Left lower leg: No edema.  Lymphadenopathy:     Cervical: No cervical adenopathy.  Neurological:     Mental Status: He is alert.  Psychiatric:        Mood and Affect: Mood normal.        Behavior: Behavior normal.        Thought Content: Thought content normal.        Judgment: Judgment normal.           08/03/2023   11:39 AM 11/27/2022    1:45 PM 11/07/2022   11:55 AM 10/29/2016    1:42 PM  Depression screen PHQ 2/9  Decreased Interest 0 0 0 0  Down, Depressed, Hopeless 0 0 0 0  PHQ - 2 Score 0 0 0 0  Altered sleeping 0 1 3   Tired, decreased energy 0 2 3   Change in appetite 0 2 1   Feeling bad or failure about yourself  0 0 0   Trouble  concentrating 0 0 0   Moving slowly or fidgety/restless 0 0 0   Suicidal thoughts 0 0 0   PHQ-9 Score 0 5 7   Difficult doing work/chores Not difficult at all Not difficult at all Somewhat difficult       08/03/2023   11:39 AM 11/27/2022    1:46 PM 11/07/2022   11:56 AM 10/26/2020   11:53 AM  GAD 7 : Generalized Anxiety Score  Nervous, Anxious, on Edge 2 1 3 2   Control/stop worrying 0 0 3 2  Worry too much - different things 0 0 3 1  Trouble relaxing 3 1 3 1   Restless 0 0 0 0  Easily annoyed or irritable 1 2 1 2   Afraid - awful might happen 0 0 1 1  Total GAD 7 Score 6 4 14 9   Anxiety Difficulty Somewhat difficult Not difficult at all Very difficult     ASSESSMENT/PLAN:  Annual physical exam Assessment & Plan: Vaccinations up to date except for flu vaccine. Lipid screening completed Encourage healthy diet and exercise    Vitamin D deficiency Assessment & Plan: Vitamin D levels very low, contributing to fatigue and difficulty focusing. High-dose vitamin D prescribed. - Refill vitamin D 1.25 mg weekly  Orders: -     Vitamin D (Ergocalciferol); Take 1 capsule (50,000 Units total) by mouth every 7 (seven) days.  Dispense: 12 capsule; Refill: 3  Mood disorder (HCC) Assessment & Plan: Intermittent anxiety likely related to job search stress. Hydroxyzine helpful but causes drowsiness. Not interested in SSRI due to withdrawal concerns. He is requesting a note for 2 weeks off work to help with his increased anxiety - Continue hydroxyzine as needed for anxiety. - Consider SSRI if symptoms persist or worsen. - Note for off work for 2 weeks    Vitamin B 12 deficiency Assessment & Plan: Vitamin B12 levels low. Advised to switch to dissolvable form for better absorption. - Switch to dissolvable vitamin B12 supplement.   Moderate mixed hyperlipidemia not requiring statin therapy Assessment & Plan: Cholesterol levels slightly elevated. No medication required. Lifestyle  modifications discussed. - Implement dietary changes to reduce cholesterol intake. - Increase physical activity to aid in cholesterol management.    PDMP reviewed  Return if symptoms worsen or fail to improve, for PCP.  Dana Allan, MD

## 2023-08-25 ENCOUNTER — Encounter: Payer: Self-pay | Admitting: Family Medicine

## 2023-08-25 DIAGNOSIS — Z0000 Encounter for general adult medical examination without abnormal findings: Secondary | ICD-10-CM | POA: Insufficient documentation

## 2023-08-25 NOTE — Assessment & Plan Note (Signed)
 Cholesterol levels slightly elevated. No medication required. Lifestyle modifications discussed. - Implement dietary changes to reduce cholesterol intake. - Increase physical activity to aid in cholesterol management.

## 2023-08-25 NOTE — Assessment & Plan Note (Addendum)
 Intermittent anxiety likely related to job search stress. Hydroxyzine helpful but causes drowsiness. Not interested in SSRI due to withdrawal concerns. He is requesting a note for 2 weeks off work to help with his increased anxiety - Continue hydroxyzine as needed for anxiety. - Consider SSRI if symptoms persist or worsen. - Note for off work for 2 weeks

## 2023-08-25 NOTE — Assessment & Plan Note (Signed)
 Vaccinations up to date except for flu vaccine. Lipid screening completed Encourage healthy diet and exercise

## 2023-08-25 NOTE — Assessment & Plan Note (Addendum)
 Vitamin D levels very low, contributing to fatigue and difficulty focusing. High-dose vitamin D prescribed. - Refill vitamin D 1.25 mg weekly

## 2023-08-25 NOTE — Assessment & Plan Note (Signed)
 Vitamin B12 levels low. Advised to switch to dissolvable form for better absorption. - Switch to dissolvable vitamin B12 supplement.

## 2023-09-02 NOTE — Telephone Encounter (Signed)
 Fyi.

## 2024-02-26 ENCOUNTER — Encounter: Payer: Self-pay | Admitting: Nurse Practitioner

## 2024-02-26 ENCOUNTER — Ambulatory Visit: Admitting: Nurse Practitioner

## 2024-02-26 ENCOUNTER — Ambulatory Visit

## 2024-02-26 VITALS — BP 122/82 | HR 81 | Temp 98.4°F | Ht 71.0 in | Wt 264.4 lb

## 2024-02-26 DIAGNOSIS — E559 Vitamin D deficiency, unspecified: Secondary | ICD-10-CM

## 2024-02-26 DIAGNOSIS — J4520 Mild intermittent asthma, uncomplicated: Secondary | ICD-10-CM

## 2024-02-26 DIAGNOSIS — M79645 Pain in left finger(s): Secondary | ICD-10-CM

## 2024-02-26 DIAGNOSIS — Z131 Encounter for screening for diabetes mellitus: Secondary | ICD-10-CM

## 2024-02-26 DIAGNOSIS — E538 Deficiency of other specified B group vitamins: Secondary | ICD-10-CM

## 2024-02-26 DIAGNOSIS — E785 Hyperlipidemia, unspecified: Secondary | ICD-10-CM

## 2024-02-26 DIAGNOSIS — F39 Unspecified mood [affective] disorder: Secondary | ICD-10-CM | POA: Diagnosis not present

## 2024-02-26 DIAGNOSIS — E782 Mixed hyperlipidemia: Secondary | ICD-10-CM | POA: Diagnosis not present

## 2024-02-26 DIAGNOSIS — M79644 Pain in right finger(s): Secondary | ICD-10-CM | POA: Diagnosis not present

## 2024-02-26 DIAGNOSIS — Z6836 Body mass index (BMI) 36.0-36.9, adult: Secondary | ICD-10-CM

## 2024-02-26 DIAGNOSIS — E66812 Obesity, class 2: Secondary | ICD-10-CM

## 2024-02-26 LAB — VITAMIN D 25 HYDROXY (VIT D DEFICIENCY, FRACTURES): VITD: 18.65 ng/mL — ABNORMAL LOW (ref 30.00–100.00)

## 2024-02-26 LAB — LIPID PANEL
Cholesterol: 242 mg/dL — ABNORMAL HIGH (ref 0–200)
HDL: 37.3 mg/dL — ABNORMAL LOW (ref >39.00)
LDL Cholesterol: 159 mg/dL — ABNORMAL HIGH (ref 0–99)
NonHDL: 205.12
Total CHOL/HDL Ratio: 6
Triglycerides: 232 mg/dL — ABNORMAL HIGH (ref 0.0–149.0)
VLDL: 46.4 mg/dL — ABNORMAL HIGH (ref 0.0–40.0)

## 2024-02-26 LAB — HEMOGLOBIN A1C: Hgb A1c MFr Bld: 6.3 % (ref 4.6–6.5)

## 2024-02-26 LAB — VITAMIN B12: Vitamin B-12: 179 pg/mL — ABNORMAL LOW (ref 211–911)

## 2024-02-26 MED ORDER — AIRSUPRA 90-80 MCG/ACT IN AERO: 2.0000 | INHALATION_SPRAY | RESPIRATORY_TRACT | 2 refills | Status: AC | PRN

## 2024-02-26 NOTE — Progress Notes (Signed)
 Established Patient Office Visit  Subjective:  Patient ID: Gilbert Herrera, male    DOB: 11-17-1990  Age: 33 y.o. MRN: [id number]  CC:  Chief Complaint  Patient presents with   Establish Care    Transfer of Care   Discussed the use of a AI scribe software for clinical note transcription with the patient, who gave verbal consent to proceed.  HPI  Deandra Gadson presents for transfer of care.  Asthma:  Intermittent flares, due to fluctuating medication costs. He finds Advair sometimes unaffordable and has tried using a more affordable alternative,but cannot recall the name. He has albuterol  inhaler, last refilled possibly six months to a year ago, and uses it only during emergencies. His last asthma flare was in the spring. He does not use his maintenance inhaler as prescribed due to cost issues.  Mood disorder: He has a history of anxiety and depression for which he was prescribed hydroxyzine , but he has only used it once since April. He notes improvement in his mood since switching from night to day shifts at work.  Vit D and b12 deficiency has not been taking the supplement.   He experienced a recent episode of severe knee pain this morning, which was alleviated by taking diclofenac, previously prescribed for foot tendinitis. He has a history of foot tendinitis treated successfully with the same medication. 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 He had a cortisone shot in 2019 or 2020, which was effective, but has been experiencing recurrent pain. He describes the pain as severe during flare-ups and notes difficulty with grip strength, especially during physical activities.  He reports dietary habits of large portion sizes and wants to eat less. He has a history of elevated cholesterol. No tobacco use and rare alcohol consumption.  HPI   Past Medical History:  Diagnosis Date   Asthma     History reviewed. No  pertinent surgical history.  Family History  Problem Relation Age of Onset   Hypertension Father    Cancer Maternal Grandmother        breast cancer   Heart attack Maternal Grandfather    Cancer Paternal Grandmother     Social History   Socioeconomic History   Marital status: Married    Spouse name: Not on file   Number of children: 2   Years of education: Not on file   Highest education level: Associate degree: academic program  Occupational History   Not on file  Tobacco Use   Smoking status: Never   Smokeless tobacco: Never  Vaping Use   Vaping status: Never Used  Substance and Sexual Activity   Alcohol use: Yes    Comment: Socially   Drug use: No   Sexual activity: Yes    Partners: Female  Other Topics Concern   Not on file  Social History Narrative   Not on file   Social Drivers of Health   Financial Resource Strain: Medium Risk (11/07/2022)   Overall Financial Resource Strain (CARDIA)    Difficulty of Paying Living Expenses: Somewhat hard  Food Insecurity: No Food Insecurity (11/07/2022)   Hunger Vital Sign    Worried About Running Out of Food in the Last Year: Never true    Ran Out of Food in the Last Year: Never true  Transportation Needs: No Transportation Needs (11/07/2022)   PRAPARE - Administrator, Civil Service (Medical): No    Lack of Transportation (  Non-Medical): No  Physical Activity: Insufficiently Active (11/07/2022)   Exercise Vital Sign    Days of Exercise per Week: 1 day    Minutes of Exercise per Session: 60 min  Stress: Stress Concern Present (11/07/2022)   [employer] of Occupational Health - Occupational Stress Questionnaire    Feeling of Stress : Rather much  Social Connections: Moderately Isolated (11/07/2022)   Social Connection and Isolation Panel    Frequency of Communication with Friends and Family: More than three times a week    Frequency of Social Gatherings with Friends and Family: Once a week    Attends  Religious Services: Never    Database administrator or Organizations: No    Attends Engineer, structural: Not on file    Marital Status: Married  Catering manager Violence: Not on file     Outpatient Medications Prior to Visit  Medication Sig Dispense Refill   cyanocobalamin  (VITAMIN B12) 1000 MCG tablet Take 2 tablets (2,000 mcg total) by mouth daily. 90 tablet 3   fluticasone -salmeterol (ADVAIR DISKUS) 250-50 MCG/ACT AEPB Inhale 1 puff into the lungs in the morning and at bedtime. Rinse after use. 60 each 11   Vitamin D , Ergocalciferol , (DRISDOL ) 1.25 MG (50000 UNIT) CAPS capsule Take 1 capsule (50,000 Units total) by mouth every 7 (seven) days. 12 capsule 3   albuterol  (VENTOLIN  HFA) 108 (90 Base) MCG/ACT inhaler Inhale 1-2 puffs into the lungs every 6 (six) hours as needed for wheezing. 6.7 g 2   fluticasone  (FLONASE ) 50 MCG/ACT nasal spray Place 2 sprays into both nostrils daily. 16 g 6   hydrOXYzine  (VISTARIL ) 25 MG capsule Take 1 capsule (25 mg total) by mouth every 8 (eight) hours as needed. 30 capsule 1   No facility-administered medications prior to visit.    Allergies  Allergen Reactions   Ceclor [Cefaclor]     rash   Doxycycline Monohydrate [Doxycycline Hyclate] Palpitations    High heart rate    ROS Review of Systems Negative unless indicated in HPI.    Objective:    Physical Exam Constitutional:      Appearance: Normal appearance.  HENT:     Mouth/Throat:     Mouth: Mucous membranes are moist.  Eyes:     Conjunctiva/sclera: Conjunctivae normal.     Pupils: Pupils are equal, round, and reactive to light.  Cardiovascular:     Rate and Rhythm: Normal rate and regular rhythm.     Pulses: Normal pulses.     Heart sounds: Normal heart sounds.  Pulmonary:     Effort: Pulmonary effort is normal.     Breath sounds: Normal breath sounds.  Abdominal:     General: Bowel sounds are normal.     Palpations: Abdomen is soft.  Musculoskeletal:         General: Tenderness (bilateral thumb) present.     Cervical back: Normal range of motion. No tenderness.  Skin:    General: Skin is warm.     Findings: No bruising.  Neurological:     General: No focal deficit present.     Mental Status: He is alert and oriented to person, place, and time. Mental status is at baseline.  Psychiatric:        Mood and Affect: Mood normal.        Behavior: Behavior normal.        Thought Content: Thought content normal.        Judgment: Judgment normal.     BP  122/82   Pulse 81   Temp 98.4 F (36.9 C)   Ht 5' 11 (1.803 m)   Wt 264 lb 6.4 oz (119.9 kg)   SpO2 96%   BMI 36.88 kg/m  Wt Readings from Last 3 Encounters:  02/26/24 264 lb 6.4 oz (119.9 kg)  08/20/23 259 lb (117.5 kg)  08/03/23 257 lb 2 oz (116.6 kg)     Health Maintenance  Topic Date Due   Pneumococcal Vaccine (1 of 2 - PCV) Never done   HPV VACCINES (1 - 3-dose SCDM series) Never done   COVID-19 Vaccine (3 - 2025-26 season) 03/11/2024 (Originally 02/08/2024)   Influenza Vaccine  09/06/2024 (Originally 01/08/2024)   DTaP/Tdap/Td (8 - Td or Tdap) 10/30/2026   Hepatitis C Screening  Completed   HIV Screening  Completed   Meningococcal B Vaccine  Aged Out   Hepatitis B Vaccines 19-59 Average Risk  Discontinued       Topic Date Due   HPV VACCINES (1 - 3-dose SCDM series) Never done    Lab Results  Component Value Date   TSH 1.55 08/03/2023   Lab Results  Component Value Date   WBC 6.6 08/03/2023   HGB 16.1 08/03/2023   HCT 48.6 08/03/2023   MCV 89.2 08/03/2023   PLT 198.0 08/03/2023   Lab Results  Component Value Date   NA 140 08/03/2023   K 4.3 08/03/2023   CO2 24 08/03/2023   GLUCOSE 95 08/03/2023   BUN 16 08/03/2023   CREATININE 1.27 08/03/2023   BILITOT 0.4 08/03/2023   ALKPHOS 73 08/03/2023   AST 20 08/03/2023   ALT 27 08/03/2023   PROT 7.7 08/03/2023   ALBUMIN 4.7 08/03/2023   CALCIUM 9.4 08/03/2023   GFR 74.88 08/03/2023   Lab Results  Component  Value Date   CHOL 242 (H) 02/26/2024   Lab Results  Component Value Date   HDL 37.30 (L) 02/26/2024   Lab Results  Component Value Date   LDLCALC 159 (H) 02/26/2024   Lab Results  Component Value Date   TRIG 232.0 (H) 02/26/2024   Lab Results  Component Value Date   CHOLHDL 6 02/26/2024   Lab Results  Component Value Date   HGBA1C 6.3 02/26/2024      Assessment & Plan:  Vitamin D  deficiency Assessment & Plan: Not taking supplement. - Will check vit D level.   Orders: -     VITAMIN D  25 Hydroxy (Vit-D Deficiency, Fractures)  Vitamin B 12 deficiency Assessment & Plan: Not taking supplement. - Will check vit B 12 level.  Orders: -     Vitamin B12  Moderate mixed hyperlipidemia not requiring statin therapy Assessment & Plan: Elevated cholesterol. Discussed dietary modifications to improve levels. - Will check lipid panel.   Mood disorder Assessment & Plan: Stable, anxiety improved after changing job. Has used hydroxyzine  once since prescribed.  -Continue Hydroxyzine  as needed.    Mild intermittent asthma without complication Assessment & Plan: Patient has been off preventative inhaler for several months due to cost. The patient has been using albuterol  inhaler as needed.  -We will try airsupra . -Refill sent to the pharmacy.    Hyperlipidemia, unspecified hyperlipidemia type Assessment & Plan: Elevated cholesterol. Discussed dietary modifications to improve levels. - Will check lipid panel.  Orders: -     Lipid panel  Screening for diabetes mellitus -     Hemoglobin A1c  Bilateral thumb pain Assessment & Plan: Chronic wrist tendinitis, pain during movement. Previous cortisone  injection effective. Discussed orthopedic referral. - Refer to [facility] for evaluation. - Order wrist x-rays to assess for arthritis.  Orders: -     DG Hand Complete Left -     DG Hand Complete Right -     Ambulatory referral to Orthopedic Surgery  Class 2 obesity  without serious comorbidity with body mass index (BMI) of 36.0 to 36.9 in adult, unspecified obesity type Assessment & Plan: Obesity with poor dietary habits. Discussed weight impact on pain and importance of balanced meals. - Encourage balanced meals with smaller portion sizes. - Increase physical activity.   Other orders -     Airsupra ; Inhale 2 puffs into the lungs as needed.  Dispense: 10.7 g; Refill: 2    Follow-up: No follow-ups on file.   Maveric Debono, NP

## 2024-02-27 ENCOUNTER — Telehealth: Payer: Self-pay | Admitting: Nurse Practitioner

## 2024-02-27 ENCOUNTER — Ambulatory Visit: Payer: Self-pay | Admitting: Nurse Practitioner

## 2024-02-27 NOTE — Telephone Encounter (Signed)
 Information for prediabetes and dietary interventions

## 2024-03-04 NOTE — Telephone Encounter (Signed)
 Patient would like referral for wrists injections.

## 2024-03-08 DIAGNOSIS — M79644 Pain in right finger(s): Secondary | ICD-10-CM | POA: Insufficient documentation

## 2024-03-08 DIAGNOSIS — Z6836 Body mass index (BMI) 36.0-36.9, adult: Secondary | ICD-10-CM | POA: Insufficient documentation

## 2024-03-08 NOTE — Assessment & Plan Note (Signed)
 Not taking supplement. - Will check vit D level.

## 2024-03-08 NOTE — Assessment & Plan Note (Signed)
 Obesity with poor dietary habits. Discussed weight impact on pain and importance of balanced meals. - Encourage balanced meals with smaller portion sizes. - Increase physical activity.

## 2024-03-08 NOTE — Assessment & Plan Note (Signed)
 Not taking supplement. - Will check vit B 12 level.

## 2024-03-08 NOTE — Assessment & Plan Note (Signed)
 Chronic wrist tendinitis, pain during movement. Previous cortisone injection effective. Discussed orthopedic referral. - Refer to [facility] for evaluation. - Order wrist x-rays to assess for arthritis.

## 2024-03-08 NOTE — Assessment & Plan Note (Signed)
 Patient has been off preventative inhaler for several months due to cost. The patient has been using albuterol  inhaler as needed.  -We will try airsupra . -Refill sent to the pharmacy.

## 2024-03-08 NOTE — Assessment & Plan Note (Signed)
 Elevated cholesterol. Discussed dietary modifications to improve levels. - Will check lipid panel.

## 2024-03-08 NOTE — Assessment & Plan Note (Signed)
 Stable, anxiety improved after changing job. Has used hydroxyzine  once since prescribed.  -Continue Hydroxyzine  as needed.

## 2024-03-16 ENCOUNTER — Other Ambulatory Visit: Payer: Self-pay

## 2024-03-16 DIAGNOSIS — J4520 Mild intermittent asthma, uncomplicated: Secondary | ICD-10-CM

## 2024-03-18 MED ORDER — FLUTICASONE-SALMETEROL 250-50 MCG/ACT IN AEPB
1.0000 | INHALATION_SPRAY | Freq: Two times a day (BID) | RESPIRATORY_TRACT | 11 refills | Status: AC
Start: 2024-03-18 — End: ?
